# Patient Record
Sex: Female | Born: 1960 | Race: White | Hispanic: No | Smoking: Current every day smoker
Health system: Southern US, Community
[De-identification: ages and names within clinical notes are randomized; demographics above are authoritative.]

## PROBLEM LIST (undated history)

## (undated) DIAGNOSIS — J45909 Unspecified asthma, uncomplicated: Secondary | ICD-10-CM

## (undated) DIAGNOSIS — M359 Systemic involvement of connective tissue, unspecified: Secondary | ICD-10-CM

## (undated) DIAGNOSIS — N200 Calculus of kidney: Secondary | ICD-10-CM

## (undated) DIAGNOSIS — M1990 Unspecified osteoarthritis, unspecified site: Secondary | ICD-10-CM

## (undated) DIAGNOSIS — I10 Essential (primary) hypertension: Secondary | ICD-10-CM

## (undated) DIAGNOSIS — J449 Chronic obstructive pulmonary disease, unspecified: Secondary | ICD-10-CM

## (undated) DIAGNOSIS — K219 Gastro-esophageal reflux disease without esophagitis: Secondary | ICD-10-CM

## (undated) DIAGNOSIS — F419 Anxiety disorder, unspecified: Secondary | ICD-10-CM

## (undated) DIAGNOSIS — E119 Type 2 diabetes mellitus without complications: Secondary | ICD-10-CM

## (undated) DIAGNOSIS — G4730 Sleep apnea, unspecified: Secondary | ICD-10-CM

## (undated) HISTORY — DX: Unspecified osteoarthritis, unspecified site: M19.90

## (undated) HISTORY — PX: JOINT REPLACEMENT: SHX530

## (undated) HISTORY — PX: TONSILLECTOMY: SUR1361

---

## 1989-06-18 HISTORY — PX: TUBAL LIGATION: SHX77

## 1998-06-18 HISTORY — PX: ARTHROSCOPIC REPAIR ACL: SUR80

## 1998-06-18 HISTORY — PX: CHOLECYSTECTOMY: SHX55

## 2003-10-17 HISTORY — PX: ABDOMINAL HYSTERECTOMY: SHX81

## 2006-05-19 ENCOUNTER — Other Ambulatory Visit: Payer: Self-pay

## 2006-05-19 ENCOUNTER — Emergency Department: Payer: Self-pay | Admitting: Emergency Medicine

## 2006-05-20 ENCOUNTER — Inpatient Hospital Stay: Payer: Self-pay | Admitting: Surgery

## 2006-05-20 ENCOUNTER — Ambulatory Visit: Payer: Self-pay | Admitting: Emergency Medicine

## 2007-10-16 ENCOUNTER — Ambulatory Visit: Payer: Self-pay | Admitting: Orthopaedic Surgery

## 2007-10-16 ENCOUNTER — Other Ambulatory Visit: Payer: Self-pay

## 2007-11-04 ENCOUNTER — Ambulatory Visit: Payer: Self-pay | Admitting: Emergency Medicine

## 2007-11-04 DIAGNOSIS — J45909 Unspecified asthma, uncomplicated: Secondary | ICD-10-CM | POA: Insufficient documentation

## 2007-11-04 DIAGNOSIS — J4489 Other specified chronic obstructive pulmonary disease: Secondary | ICD-10-CM | POA: Insufficient documentation

## 2007-11-04 DIAGNOSIS — G4710 Hypersomnia, unspecified: Secondary | ICD-10-CM | POA: Insufficient documentation

## 2007-11-04 DIAGNOSIS — J449 Chronic obstructive pulmonary disease, unspecified: Secondary | ICD-10-CM | POA: Insufficient documentation

## 2007-11-04 DIAGNOSIS — I10 Essential (primary) hypertension: Secondary | ICD-10-CM | POA: Insufficient documentation

## 2007-11-04 DIAGNOSIS — G4730 Sleep apnea, unspecified: Secondary | ICD-10-CM

## 2007-11-12 ENCOUNTER — Encounter: Payer: Self-pay | Admitting: Emergency Medicine

## 2007-11-12 ENCOUNTER — Ambulatory Visit: Payer: Self-pay | Admitting: Pulmonary Disease

## 2007-11-12 ENCOUNTER — Ambulatory Visit (HOSPITAL_BASED_OUTPATIENT_CLINIC_OR_DEPARTMENT_OTHER): Admission: RE | Admit: 2007-11-12 | Discharge: 2007-11-12 | Payer: Self-pay | Admitting: Emergency Medicine

## 2007-11-18 ENCOUNTER — Ambulatory Visit: Payer: Self-pay | Admitting: Emergency Medicine

## 2007-11-18 ENCOUNTER — Encounter: Payer: Self-pay | Admitting: Emergency Medicine

## 2007-12-03 ENCOUNTER — Ambulatory Visit: Payer: Self-pay | Admitting: Emergency Medicine

## 2007-12-03 DIAGNOSIS — F17200 Nicotine dependence, unspecified, uncomplicated: Secondary | ICD-10-CM | POA: Insufficient documentation

## 2007-12-03 DIAGNOSIS — R0602 Shortness of breath: Secondary | ICD-10-CM | POA: Insufficient documentation

## 2007-12-04 ENCOUNTER — Telehealth: Payer: Self-pay | Admitting: Emergency Medicine

## 2007-12-04 ENCOUNTER — Telehealth (INDEPENDENT_AMBULATORY_CARE_PROVIDER_SITE_OTHER): Payer: Self-pay | Admitting: *Deleted

## 2007-12-31 ENCOUNTER — Telehealth (INDEPENDENT_AMBULATORY_CARE_PROVIDER_SITE_OTHER): Payer: Self-pay | Admitting: *Deleted

## 2008-01-01 ENCOUNTER — Telehealth (INDEPENDENT_AMBULATORY_CARE_PROVIDER_SITE_OTHER): Payer: Self-pay | Admitting: *Deleted

## 2008-01-07 ENCOUNTER — Ambulatory Visit: Payer: Self-pay | Admitting: Orthopedic Surgery

## 2008-01-08 ENCOUNTER — Ambulatory Visit: Payer: Self-pay | Admitting: Orthopedic Surgery

## 2008-04-12 ENCOUNTER — Ambulatory Visit: Payer: Self-pay | Admitting: Emergency Medicine

## 2008-04-12 DIAGNOSIS — R05 Cough: Secondary | ICD-10-CM

## 2008-04-12 DIAGNOSIS — R059 Cough, unspecified: Secondary | ICD-10-CM | POA: Insufficient documentation

## 2008-05-21 ENCOUNTER — Encounter: Payer: Self-pay | Admitting: Emergency Medicine

## 2008-09-10 ENCOUNTER — Emergency Department: Payer: Self-pay | Admitting: Emergency Medicine

## 2008-11-05 ENCOUNTER — Ambulatory Visit: Payer: Self-pay | Admitting: Cardiovascular Disease

## 2010-01-26 ENCOUNTER — Telehealth (INDEPENDENT_AMBULATORY_CARE_PROVIDER_SITE_OTHER): Payer: Self-pay | Admitting: *Deleted

## 2010-07-18 NOTE — Progress Notes (Signed)
  Phone Note Other Incoming   Request: Send information Summary of Call: Request for records received from [employer] Request forwarded to [facility].     

## 2010-09-08 ENCOUNTER — Ambulatory Visit: Payer: Self-pay | Admitting: Emergency Medicine

## 2010-09-08 HISTORY — PX: HERNIA REPAIR: SHX51

## 2010-10-12 ENCOUNTER — Ambulatory Visit: Payer: Self-pay | Admitting: Internal Medicine

## 2010-10-31 NOTE — Procedures (Signed)
NAME:  Stacey Beck, Stacey Beck NO.:  [id number]   MEDICAL RECORD NO.:  [id number]          PATIENT TYPE:  OUT   LOCATION:  SLEEP CENTER                 FACILITY:  [facility]   PHYSICIAN:  Coralyn Helling, MD        DATE OF BIRTH:  Mar 27, 1961   DATE OF STUDY:  11/12/2007                            NOCTURNAL POLYSOMNOGRAM   REFERRING PHYSICIAN:  Leslye Peer, MD   INDICATION FOR STUDY:  Stacey Beck is a 50 year old female who has a  history of hypertension, obesity, COPD, tobacco abuse and overnight  hypoxemia.  She is referred to the Sleep Lab for evaluation of  hypersomnia with obstructive sleep apnea.   EPWORTH SLEEPINESS SCORE:  1.   Height is 5 feet 7 inches tall, weight is 268 pounds, BMI is 42, neck  size is 17.   MEDICATIONS:  Perforomist, Asmanex, meloxicam, Spiriva, Diovan, Xopenex,  omeprazole, bupropion, Lunesta and Lovaza.   SLEEP ARCHITECTURE:  Sleep period time was 409 minutes, total sleep time  was 388 minutes, sleep efficiency was 93%.  Sleep latency is 6 minutes,  which is significantly reduced, REM latency is 79 minutes.  The patient  was observed in all stages of sleep but had a reduction in the  percentage of stage III sleep.  The patient slept in both supine and non-  supine positions.   RESPIRATORY DATA:  The average respiratory rate was 14, the overall  apnea-hypopnea index was 8, the events were exclusively obstructive in  nature.  The REM apnea-hypopnea index was 31, the REM supine apnea-  hypopnea index was 53, the REM non-supine apnea-hypopnea index was 22,  the non-REM apnea-hypopnea index was 2.5, the non-REM supine apnea-  hypopnea index was 3.8, the non-REM non-supine apnea-hypopnea index was  1.2.  The supine apnea-hypopnea index was 9.1, the non-supine apnea-  hypopnea index was 20.9.  Loud snoring was noted by the technician.   OXYGEN DATA:  The baseline oxygenation was 94%.  The oxygen saturation  nadir was 81%.  The patient spent a  total of 9 minutes with an oxygen  saturation less than 90% and 5 minutes with an oxygen saturation less  than 88%.   CARDIAC DATA:  The average heart rate was 57 and the rhythm strip showed  normal sinus rhythm.   MOVEMENT-PARASOMNIA:  The periodic limb movement index was 0.  The  patient had one rhythm strip.   IMPRESSIONS-RECOMMENDATIONS:  This study shows evidence for sleep  disordered breathing as the apnea-hypopnea index was 8.  She had a  preponderance of events during REM sleep, particularly in the supine  position.  What I would recommend initially is counseling with regards  to the importance of diet, exercise and weight reduction.  In addition,  she may also benefit from additional therapy for her sleep apnea which  could include either CPAP therapy, oral appliance or surgical  intervention.  Further discussion with the patient will be necessary to  determine what would be the optimal therapy for her.      Coralyn Helling, MD  Diplomat, American Board of Sleep Medicine  Electronically Signed  VS/MEDQ  D:  11/16/2007 08:54:13  T:  11/16/2007 09:40:24  Job:  [id number]

## 2011-07-18 ENCOUNTER — Inpatient Hospital Stay: Payer: Self-pay | Admitting: Internal Medicine

## 2011-07-18 LAB — COMPREHENSIVE METABOLIC PANEL
BUN: 10 mg/dL (ref 7–18)
Calcium, Total: 8.4 mg/dL — ABNORMAL LOW (ref 8.5–10.1)
Chloride: 104 mmol/L (ref 98–107)
Co2: 27 mmol/L (ref 21–32)
EGFR (African American): >60
EGFR (Non-African Amer.): 58 — ABNORMAL LOW
SGOT(AST): 28 U/L (ref 15–37)
SGPT (ALT): 60 U/L
Total Protein: 7.4 g/dL (ref 6.4–8.2)

## 2011-07-18 LAB — CBC
HGB: 15.4 g/dL (ref 12.0–16.0)
RBC: 4.93 10*6/uL (ref 3.80–5.20)
RDW: 12.6 % (ref 11.5–14.5)
WBC: 3.9 10*3/uL (ref 3.6–11.0)

## 2011-07-18 LAB — CK TOTAL AND CKMB (NOT AT ARMC): CK-MB: 0.8 ng/mL (ref 0.5–3.6)

## 2011-07-19 LAB — BASIC METABOLIC PANEL
Calcium, Total: 8.7 mg/dL (ref 8.5–10.1)
Chloride: 102 mmol/L (ref 98–107)
Co2: 27 mmol/L (ref 21–32)
Creatinine: 0.82 mg/dL (ref 0.60–1.30)
EGFR (African American): >60
Potassium: 4.6 mmol/L (ref 3.5–5.1)
Sodium: 138 mmol/L (ref 136–145)

## 2011-07-19 LAB — CBC WITH DIFFERENTIAL/PLATELET
Basophil #: 0 10*3/uL (ref 0.0–0.1)
Basophil %: 0.2 %
Eosinophil %: 0 %
HCT: 46.5 % (ref 35.0–47.0)
MCV: 92 fL (ref 80–100)
Monocyte %: 2.5 %
Neutrophil #: 9.2 10*3/uL — ABNORMAL HIGH (ref 1.4–6.5)
RDW: 12.6 % (ref 11.5–14.5)
WBC: 10 10*3/uL (ref 3.6–11.0)

## 2011-11-06 ENCOUNTER — Ambulatory Visit: Payer: Self-pay | Admitting: Family Medicine

## 2012-01-09 ENCOUNTER — Emergency Department: Payer: Self-pay | Admitting: *Deleted

## 2012-11-29 ENCOUNTER — Emergency Department: Payer: Self-pay | Admitting: Emergency Medicine

## 2013-05-27 ENCOUNTER — Ambulatory Visit: Payer: Self-pay | Admitting: Family Medicine

## 2013-06-18 HISTORY — PX: BREAST SURGERY: SHX581

## 2013-07-06 ENCOUNTER — Ambulatory Visit: Payer: Self-pay | Admitting: Gastroenterology

## 2013-07-08 LAB — PATHOLOGY REPORT

## 2013-07-09 ENCOUNTER — Emergency Department: Payer: Self-pay | Admitting: Emergency Medicine

## 2013-07-09 LAB — URINALYSIS, COMPLETE
BACTERIA: NONE SEEN
BLOOD: NEGATIVE
Bilirubin,UR: NEGATIVE
Glucose,UR: NEGATIVE mg/dL (ref 0–75)
KETONE: NEGATIVE
LEUKOCYTE ESTERASE: NEGATIVE
NITRITE: NEGATIVE
PH: 6 (ref 4.5–8.0)
Protein: NEGATIVE
Specific Gravity: 1.02 (ref 1.003–1.030)
Squamous Epithelial: 4

## 2013-07-09 LAB — COMPREHENSIVE METABOLIC PANEL
ALBUMIN: 3.8 g/dL (ref 3.4–5.0)
Alkaline Phosphatase: 97 U/L
Anion Gap: 5 — ABNORMAL LOW (ref 7–16)
BUN: 13 mg/dL (ref 7–18)
Bilirubin,Total: 0.3 mg/dL (ref 0.2–1.0)
CALCIUM: 9.4 mg/dL (ref 8.5–10.1)
CO2: 29 mmol/L (ref 21–32)
CREATININE: 1.02 mg/dL (ref 0.60–1.30)
Chloride: 106 mmol/L (ref 98–107)
EGFR (African American): >60
EGFR (Non-African Amer.): >60
Glucose: 160 mg/dL — ABNORMAL HIGH (ref 65–99)
Osmolality: 283 (ref 275–301)
POTASSIUM: 4.1 mmol/L (ref 3.5–5.1)
SGOT(AST): 27 U/L (ref 15–37)
SGPT (ALT): 54 U/L (ref 12–78)
SODIUM: 140 mmol/L (ref 136–145)
TOTAL PROTEIN: 7.4 g/dL (ref 6.4–8.2)

## 2013-07-09 LAB — CBC
HCT: 47.6 % — ABNORMAL HIGH (ref 35.0–47.0)
HGB: 16.2 g/dL — ABNORMAL HIGH (ref 12.0–16.0)
MCH: 31.2 pg (ref 26.0–34.0)
MCHC: 34 g/dL (ref 32.0–36.0)
MCV: 92 fL (ref 80–100)
Platelet: 214 10*3/uL (ref 150–440)
RBC: 5.2 10*6/uL (ref 3.80–5.20)
RDW: 12.9 % (ref 11.5–14.5)
WBC: 6 10*3/uL (ref 3.6–11.0)

## 2013-07-15 ENCOUNTER — Ambulatory Visit: Payer: Self-pay | Admitting: Surgery

## 2014-03-04 ENCOUNTER — Emergency Department: Payer: Self-pay | Admitting: Emergency Medicine

## 2014-10-10 NOTE — Discharge Summary (Signed)
PATIENT NAME:  Stacey AlaminLLEN, Stacey Beck MR#:  [id number] DATE OF BIRTH:  1961/05/28  DATE OF ADMISSION:  07/18/2011 DATE OF DISCHARGE:  07/19/2011  PRIMARY CARE PHYSICIAN:  Dr. Lacie ScottsNiemeyer  CONSULTATIONS:  None.  DISCHARGE DIAGNOSES:  1. Chronic obstructive pulmonary disease exacerbation.  2. Hypertension.  3. Gastroesophageal reflux disease.  4. Anxiety and depression.  5. Tobacco abuse.   DISCHARGE MEDICATIONS:  1. Tramadol 50 mg 4 times daily as needed for pain. 2. Bupropion 450 mg extended release daily.  3. Flexeril 10 mg t.i.d. as needed.  4. Amitiza 24 mcg p.o. b.i.d.  5. Advair Diskus 250/50 1 puff b.i.d.  6. Hydrochlorothiazide/ enalapril 12.5/20 mg p.o. daily.  7. Nexium 40 mg p.o. daily.  8. Alprazolam 1 mg 3 times daily.  9. Flonase 0.05 mg inhalation once per 2 times daily.  10. Prednisone 40 mg daily for two days, 30 mg daily for two days, 20 mg daily for two days, 10 mg daily for two days, then stop. 11. Levaquin 500 mg p.o. daily for five days.   DIET: Low sodium diet.   HOSPITAL COURSE:  411. 54 year old female patient with history of tobacco abuse who came in because of trouble breathing and cough. The patient was given nebulizers and antibiotics. The patient did not get better so she came in. When she came her lungs had bilateral wheezing and labs were essentially within normal limits. Chest x-ray showed no pneumonia and influenza titers were negative. She was started on IV steroids along with antibiotics and nebulizers and oxygen. The patient started to feel much better with no wheezing today and oxygen levels are 96% on room air.  The patient is afebrile. She wanted to go home. I am discharging her with Levaquin and also prednisone taper and she can continue her Advair along with her Ventolin as needed.  2. The patient has a history of hypertension. She is on quinapril and HCTZ. Continue that.  3. The patient has a history of gastroesophageal reflux disease on PPIs.  Continue. 4. History of anxiety and depression on alprazolam. She can continue. 5. History of tobacco abuse. She is sure she will quit. She is on bupropion. Continue that.  6. We are going to get oxygenation on exertion and see if she needs any oxygen.  She wants to follow with Dr. Meredeth IdeFleming of pulmonology for pulmonary function testing. We will give her an appointment to follow up as an outpatient. 7. The patient's condition at this time is stable.  LABORATORY DATA:  Electrolytes: Sodium 138, potassium 4.6, chloride 102, bicarbonate 27, BUN 14, creatinine 1.82, glucose 197 today. WBC this morning 10, hemoglobin is 15.6, hematocrit 46.5, platelets 143. Influenza titers are negative. The patient's troponins have been negative. Chest x-ray showed no acute cardiopulmonary abnormality.   TOTAL TIME SPENT ON DISCHARGE PREPARATION: More than 30 minutes.    ____________________________ Katha HammingSnehalatha Vernita Tague, MD sk:bjt D: 07/19/2011 12:42:13 ET T: 07/20/2011 12:20:10 ET JOB#: [id number]  cc: Katha HammingSnehalatha Maecie Sevcik, MD, <Dictator> Katha HammingSNEHALATHA Zaven Klemens MD ELECTRONICALLY SIGNED 08/06/2011 16:19

## 2014-10-10 NOTE — H&P (Signed)
PATIENT NAME:  Stacey Beck, Philena H MR#:  [id number] DATE OF BIRTH:  03-Apr-1961  DATE OF ADMISSION:  07/17/2011  CHIEF COMPLAINT: Shortness of breath and cough.   HISTORY OF PRESENT ILLNESS: The patient is a 54 year old Caucasian female who presents with chief complaint of symptoms that began two weeks ago. The patient saw her primary care physician about a week back and she was put on nebulizers and azithromycin. She has not had much relief. She reports that she stopped taking her albuterol a couple of months ago. She restarted the nebulizers two days ago.  The antibiotics and nebulizers have helped her symptoms partially. She has developed a productive cough. She is bringing up clear sputum. The patient reports intermittent chest pain associated with the cough. The patient has never been intubated or put on a ventilator.   PAST MEDICAL HISTORY:  1. Gastroesophageal reflux disease. 2. Arthritis.  3. Anemia.  4. Asthma. 5. Chronic obstructive pulmonary disease.  6. Depression and anxiety.  7. Sleep apnea.  8. Tonsillectomy.  9. Left knee surgery.  10. Tubal ligation. 11. Partial hysterectomy.   ALLERGIES: No known drug allergies.   CURRENT MEDICATIONS:  1. Advair Diskus 250/50 mcg 1 puff b.i.d.  2. Alprazolam 1 mg p.o. t.i.d.  3. Amitiza 24 mcg p.o. b.i.d.  4. Azithromycin 500 mg p.o. daily.  5. Budesonide one inhaler b.i.d.  6. Bupropion 150 mg p.o. q. 24 hours.  7. Cyclobenzaprine 10 mg p.o. t.i.d.  8. Flonase 0.05, one spray b.i.d.  9. Hydrochlorothiazide 12.5/20 p.o. daily.  10. Nexium 40 mg p.o. daily.  11. Prednisone 50 mg p.o. daily.  12. Tramadol 50 mg p.o. 4 times daily.   ALLERGIES: No known drug allergies.   SOCIAL HISTORY: The patient reports she smokes 1 pack per day. She denies alcohol abuse or drug abuse. She is disabled.   FAMILY HISTORY: Positive for diabetes.   REVIEW OF SYSTEMS: CONSTITUTIONAL: The patient denies any fevers, chills, or night sweats. HEENT: The  patient denies any hearing loss, dysphagia, visual problems, or sore throat.  CARDIOVASCULAR: The patient denies any orthopnea, PND, or syncope. RESPIRATORY: The patient denies hemoptysis. GI: The patient denies any abdominal pain, hematemesis, hematochezia, or melena. GU: The patient denies any hematuria, dysuria, or frequency. NEURO: The patient denies any headache, focal weakness, or seizures. SKIN: The patient denies any lesions or rash. ENDOCRINE: The patient denies any polyuria, polyphagia, or polydipsia.  MUSCULOSKELETAL: The patient denies any arthritis, joint effusion, or swelling. HEMATOLOGIC: The patient denies any easy bleeding or bruises.   PHYSICAL EXAMINATION:  VITAL SIGNS: Temperature 97.1, heart rate 103, respiratory rate 26, blood pressure 167/81, O2 sats 99%.   HEENT: Atraumatic, normocephalic. Pupils are equal, round, and reactive to light. Extraocular movements intact. Sclerae anicteric. Mucous membranes are moist.   NECK: Supple. No organomegaly.   CARDIOVASCULAR:  S1, S2. Regular rate and rhythm. No gallops. No thrills. No murmurs.   LUNGS: The patient has wheezing bilaterally. There are no rales, no rhonchi.   GI: Abdomen is soft, nontender, nondistended. Normal bowel sounds. No hepatosplenomegaly.   GU: There is no hematuria or masses noted.   SKIN: No lesions, no rash.   ENDOCRINE:  No masses, no thyromegaly.   LYMPH: No lymphadenopathy or nodes palpable.   NEUROLOGIC: Cranial nerves II through XII grossly intact. Motor strength is five out of five in bilateral upper and lower extremities. Sensation within normal limits. No focal neurological deficit noted on examination.   MUSCULOSKELETAL: No arthritis, joint  effusion, or swelling.  HEMATOLOGIC:  No ecchymosis, no bleeding, and no petechiae.   EXTREMITIES: No cyanosis, clubbing, or edema. 2+ pedal pulses noted bilaterally.   LABORATORY DATA: Total CK 110, CK-MB 0.8. WBC count 3900, hemoglobin 15.4,  hematocrit 44.4, platelet count 159.  Glucose 175, BUN 10, creatinine 1.07. Sodium 141, potassium 4.2, chloride 104, CK total 27, calcium 8.4, total bilirubin 0.2. Alkaline phosphatase 75, ALT 60, AST 28, total protein 7.4, albumin 3.7. Estimated GFR 58. Troponin less than 0.02. Influenza swab is negative.  ASSESSMENT AND PLAN: 1. The patient is a 54 year old female who presents with chief complaint of shortness of breath and acute chronic obstructive pulmonary disease exacerbation. Admit to the medical floor, start the patient on chronic obstructive pulmonary disease clinical pathway orders. IV Solu-Medrol, nebulizers, IV fluids, oxygen.  2. Hypertension. Continue Coreg and hydrochlorothiazide.  3. Hyperlipidemia. Continue Lipitor. 4. Gastroesophageal reflux disease. Continue omeprazole.  5. Tobacco abuse. Smoking cessation recommended.  ____________________________ Donia Ast, MD jsp:bjt D:  07/18/2011 07:24:13 ET         T: 07/18/2011 08:08:53 ET          JOB#: [id number]  cc: Donia Ast, MD, <Dictator> Donia Ast MD ELECTRONICALLY SIGNED 07/18/2011 22:03

## 2014-11-01 IMAGING — CT CT ABD-PELV W/ CM
2 of 5 series · 17 of 46 positions shown, 19 images · IV contrast (isovue)
Comparison: US RENAL KIDNEY dated 07/09/2013

CLINICAL DATA: Epigastric abdominal pain. Evaluate for ventral
hernia. Left low back pain worsening for 3 weeks.

EXAM:
CT ABDOMEN AND PELVIS WITH CONTRAST
TECHNIQUE: Multidetector CT imaging of the abdomen and pelvis was performed
using the standard protocol following bolus administration of
intravenous contrast.
CONTRAST:  125 cc Isovue 370

[Series 2: routine with · axial · 0.84mm/px · z∈[-966,-566]mm · 14 of 91 slices shown, 16 images]
[im 6/91  soft-tissue]
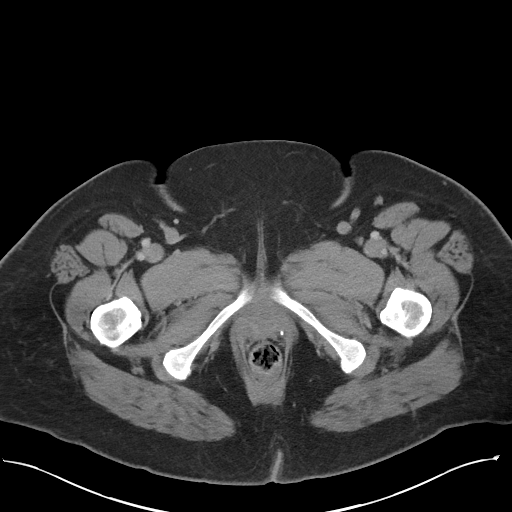
[im 6/91  bone]
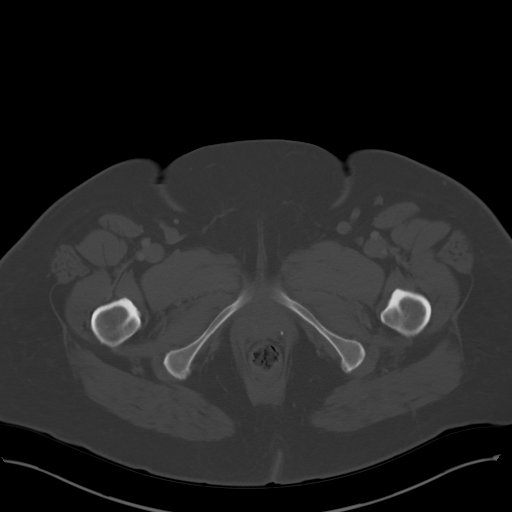
[im 11/91  soft-tissue]
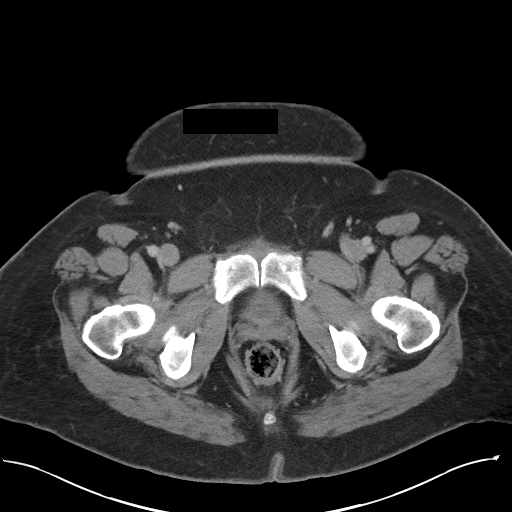
[im 21/91  soft-tissue]
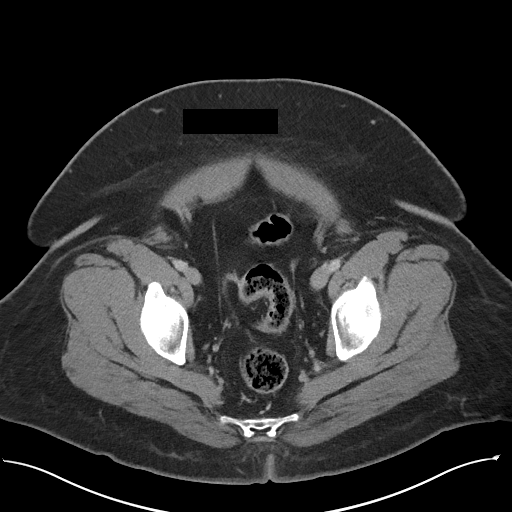
[im 26/91  soft-tissue]
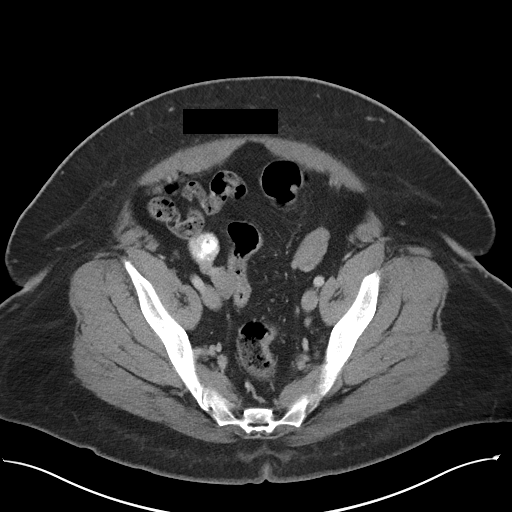
[im 31/91  soft-tissue]
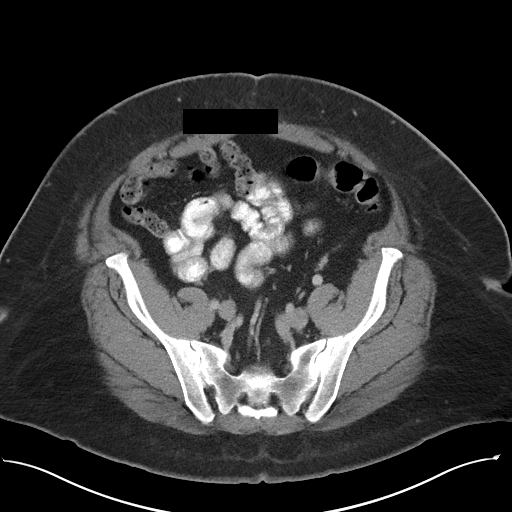
[im 36/91  soft-tissue]
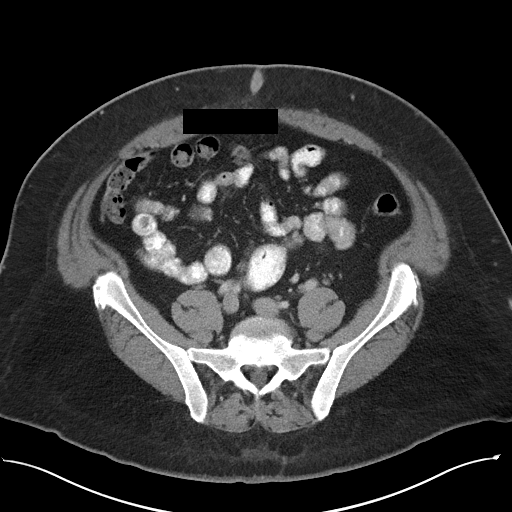
[im 41/91  soft-tissue]
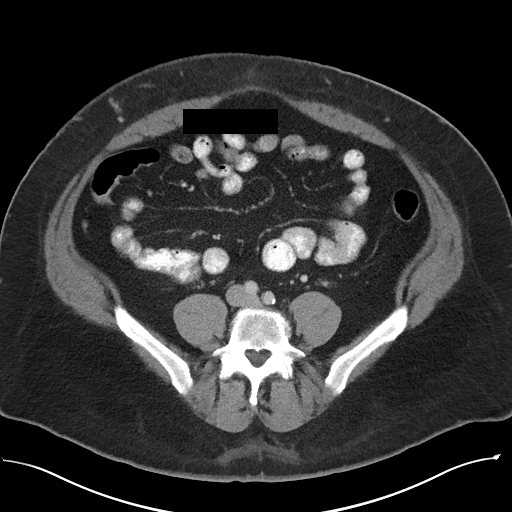
[im 51/91  soft-tissue]
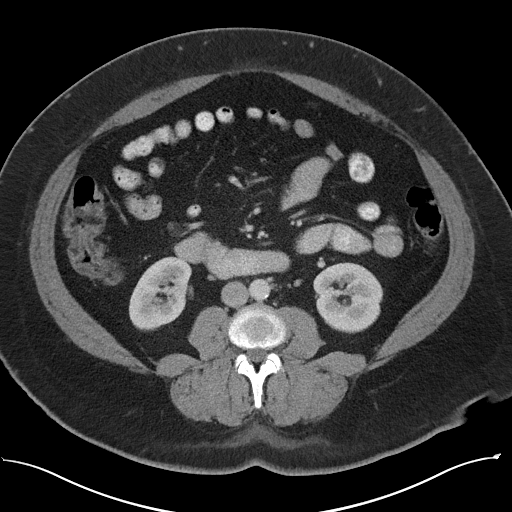
[im 56/91  soft-tissue]
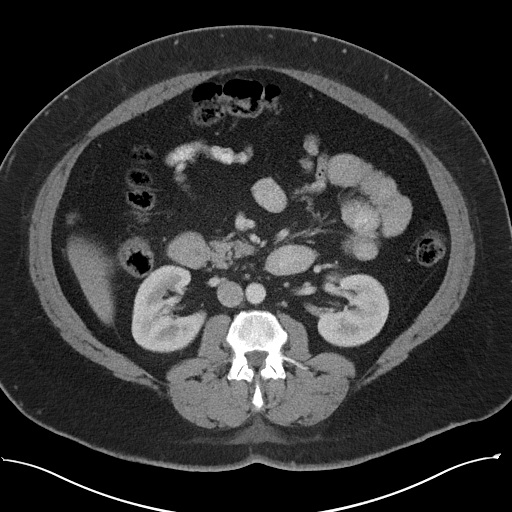
[im 56/91  bone]
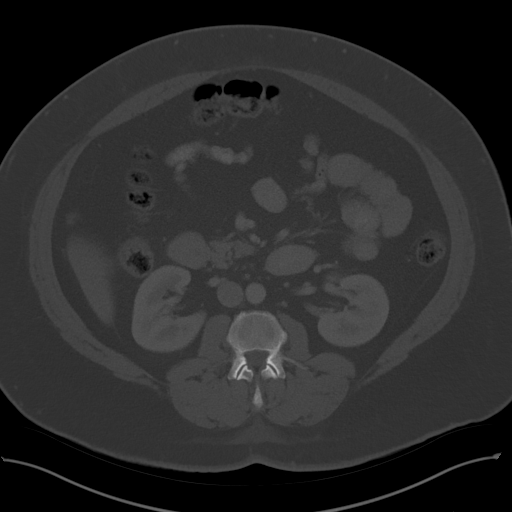
[im 61/91  soft-tissue]
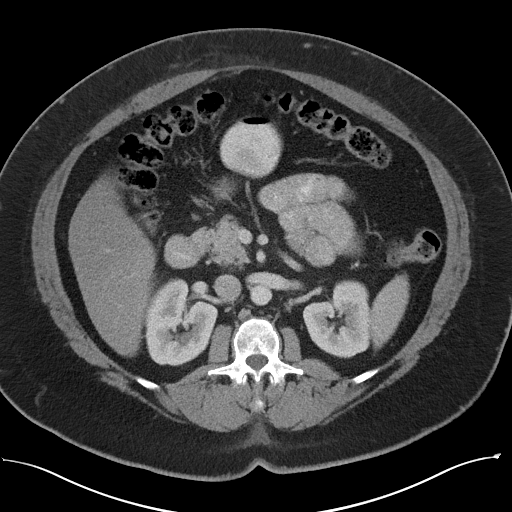
[im 66/91  soft-tissue]
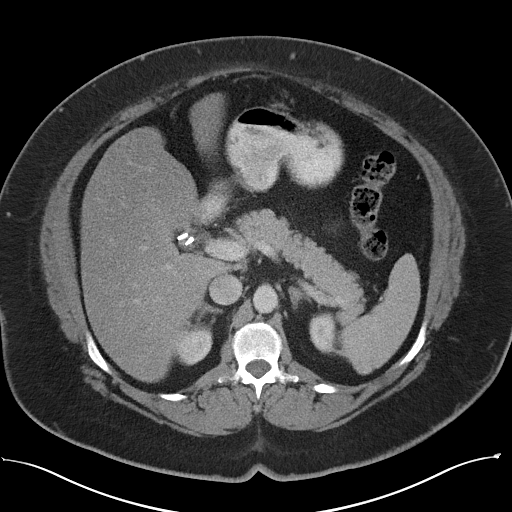
[im 71/91  soft-tissue]
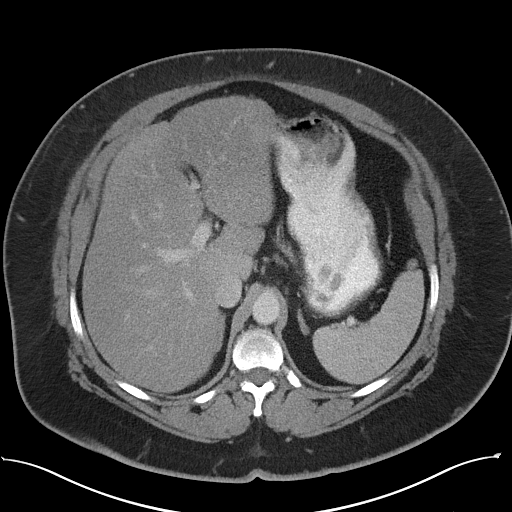
[im 81/91  soft-tissue]
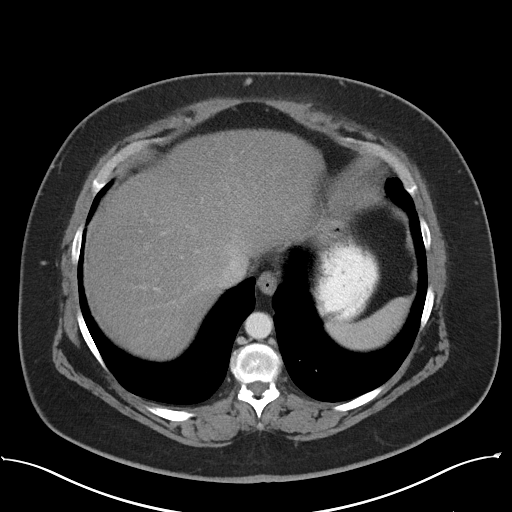
[im 86/91  soft-tissue]
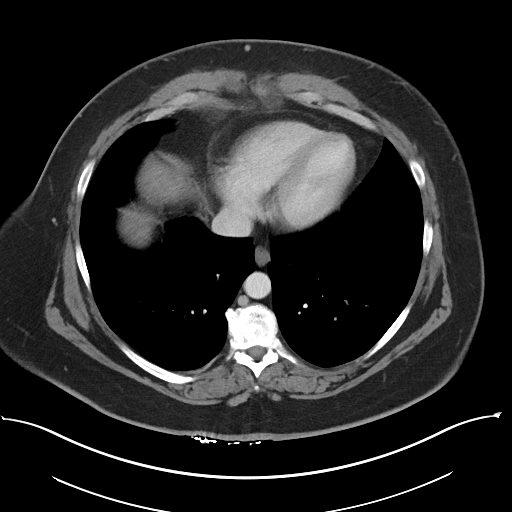

[Series 6: cor routine with · coronal · 0.89mm/px · 3 of 204 slices shown]
[im 68/204  soft-tissue]
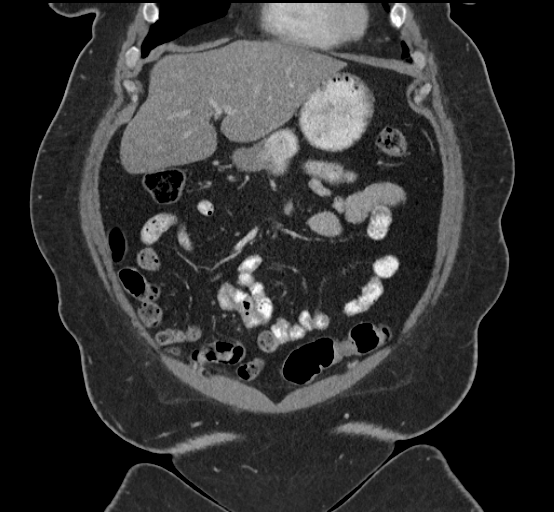
[im 91/204  soft-tissue]
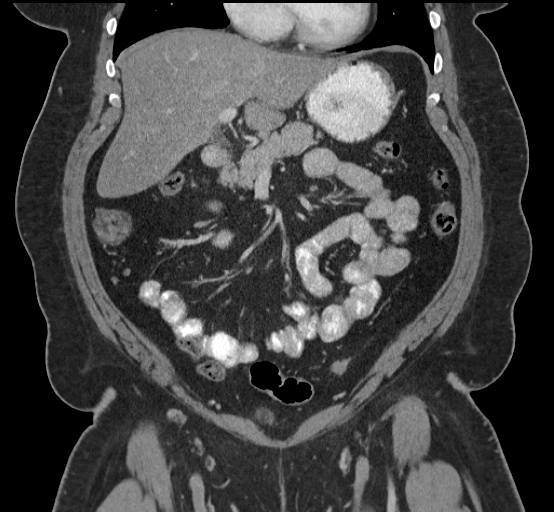
[im 113/204  soft-tissue]
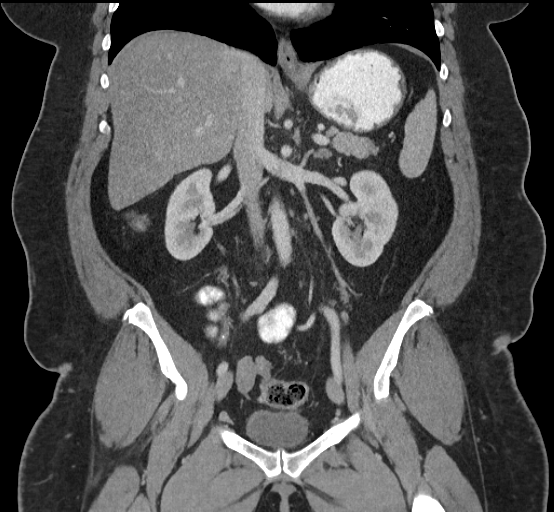

[17 of 46 positions shown; findings below may reference images not displayed]

FINDINGS: Lower Chest: Clear lung bases. Normal heart size without pericardial
or pleural effusion.

Abdomen/Pelvis: Mild to moderate hepatic steatosis. No focal liver
lesion.

Normal spleen, stomach, pancreas. Cholecystectomy, without biliary
ductal dilatation. Descending duodenal diverticulum. Normal right
adrenal gland. Mild left adrenal thickening. Normal kidneys.
Multiple renal arteries bilaterally. Early aortic atherosclerosis.
No retroperitoneal or retrocrural adenopathy. Normal colon,
appendix, and terminal ileum. Normal small bowel without abdominal
ascites. Borderline enlarged right external iliac node, likely
reactive. 1.0 cm. Normal urinary bladder. Hysterectomy. No adnexal
mass. No significant free fluid.

Bones/Musculoskeletal:  No acute osseous abnormality.
IMPRESSION: 1.  No acute process in the abdomen or pelvis.
2. Hepatic steatosis.

## 2015-01-26 ENCOUNTER — Emergency Department
Admission: EM | Admit: 2015-01-26 | Discharge: 2015-01-26 | Disposition: A | Discharge status: Home / Self Care | Payer: Medicare Other | Attending: Emergency Medicine | Admitting: Emergency Medicine

## 2015-01-26 ENCOUNTER — Emergency Department: Payer: Medicare Other

## 2015-01-26 ENCOUNTER — Encounter: Payer: Self-pay | Admitting: Emergency Medicine

## 2015-01-26 DIAGNOSIS — Z720 Tobacco use: Secondary | ICD-10-CM | POA: Insufficient documentation

## 2015-01-26 DIAGNOSIS — I10 Essential (primary) hypertension: Secondary | ICD-10-CM | POA: Diagnosis not present

## 2015-01-26 DIAGNOSIS — N23 Unspecified renal colic: Secondary | ICD-10-CM | POA: Diagnosis not present

## 2015-01-26 DIAGNOSIS — Z79899 Other long term (current) drug therapy: Secondary | ICD-10-CM | POA: Insufficient documentation

## 2015-01-26 DIAGNOSIS — M549 Dorsalgia, unspecified: Secondary | ICD-10-CM | POA: Diagnosis present

## 2015-01-26 DIAGNOSIS — Z7951 Long term (current) use of inhaled steroids: Secondary | ICD-10-CM | POA: Insufficient documentation

## 2015-01-26 DIAGNOSIS — R21 Rash and other nonspecific skin eruption: Secondary | ICD-10-CM | POA: Diagnosis not present

## 2015-01-26 DIAGNOSIS — N12 Tubulo-interstitial nephritis, not specified as acute or chronic: Secondary | ICD-10-CM | POA: Diagnosis not present

## 2015-01-26 HISTORY — DX: Calculus of kidney: N20.0

## 2015-01-26 LAB — URINALYSIS COMPLETE WITH MICROSCOPIC (ARMC ONLY)
BILIRUBIN URINE: NEGATIVE
Glucose, UA: NEGATIVE mg/dL
Ketones, ur: NEGATIVE mg/dL
Nitrite: NEGATIVE
PH: 5 (ref 5.0–8.0)
Protein, ur: 30 mg/dL — AB
SPECIFIC GRAVITY, URINE: 1.02 (ref 1.005–1.030)

## 2015-01-26 LAB — BASIC METABOLIC PANEL
Anion gap: 9 (ref 5–15)
BUN: 14 mg/dL (ref 6–20)
CALCIUM: 9.6 mg/dL (ref 8.9–10.3)
CHLORIDE: 100 mmol/L — AB (ref 101–111)
CO2: 32 mmol/L (ref 22–32)
CREATININE: 0.98 mg/dL (ref 0.44–1.00)
GFR calc non Af Amer: >60 mL/min (ref >60)
Glucose, Bld: 140 mg/dL — ABNORMAL HIGH (ref 65–99)
Potassium: 4.8 mmol/L (ref 3.5–5.1)
Sodium: 141 mmol/L (ref 135–145)

## 2015-01-26 LAB — CBC
HCT: 48.1 % — ABNORMAL HIGH (ref 35.0–47.0)
Hemoglobin: 16.3 g/dL — ABNORMAL HIGH (ref 12.0–16.0)
MCH: 31.1 pg (ref 26.0–34.0)
MCHC: 34 g/dL (ref 32.0–36.0)
MCV: 91.5 fL (ref 80.0–100.0)
PLATELETS: 197 10*3/uL (ref 150–440)
RBC: 5.26 MIL/uL — ABNORMAL HIGH (ref 3.80–5.20)
RDW: 12.9 % (ref 11.5–14.5)
WBC: 7.5 10*3/uL (ref 3.6–11.0)

## 2015-01-26 MED ORDER — OXYCODONE-ACETAMINOPHEN 5-325 MG PO TABS: 1.0000 | ORAL_TABLET | Freq: Four times a day (QID) | ORAL | Status: DC | PRN

## 2015-01-26 MED ORDER — HYDROMORPHONE HCL 1 MG/ML IJ SOLN
1.0000 mg | Freq: Once | INTRAMUSCULAR | Status: AC
  Administered 2015-01-26: 1 mg via INTRAMUSCULAR
  Filled 2015-01-26: qty 1

## 2015-01-26 MED ORDER — OXYCODONE-ACETAMINOPHEN 5-325 MG PO TABS: 1.0000 | ORAL_TABLET | Freq: Once | ORAL | Status: DC

## 2015-01-26 MED ORDER — ONDANSETRON 4 MG PO TBDP
4.0000 mg | ORAL_TABLET | Freq: Once | ORAL | Status: AC
  Administered 2015-01-26: 4 mg via ORAL
  Filled 2015-01-26: qty 1

## 2015-01-26 MED ORDER — KETOROLAC TROMETHAMINE 10 MG PO TABS: 10.0000 mg | ORAL_TABLET | Freq: Three times a day (TID) | ORAL | Status: DC | PRN

## 2015-01-26 MED ORDER — SULFAMETHOXAZOLE-TRIMETHOPRIM 800-160 MG PO TABS: 1.0000 | ORAL_TABLET | Freq: Two times a day (BID) | ORAL | Status: DC

## 2015-01-26 MED ORDER — CEFTRIAXONE SODIUM 1 G IJ SOLR
1.0000 g | Freq: Once | INTRAMUSCULAR | Status: AC
  Administered 2015-01-26: 1 g via INTRAMUSCULAR
  Filled 2015-01-26: qty 10

## 2015-01-26 MED ORDER — OXYCODONE-ACETAMINOPHEN 5-325 MG PO TABS
1.0000 | ORAL_TABLET | Freq: Once | ORAL | Status: AC
  Administered 2015-01-26: 1 via ORAL
  Filled 2015-01-26: qty 1

## 2015-01-26 NOTE — ED Notes (Signed)
Patient ambulatory to triage with steady gait, without difficulty or distress noted; pt reports lower back pain with urinary frequency x week; st hx kidney stones

## 2015-01-26 NOTE — ED Notes (Signed)
MD at bedside. 

## 2015-01-26 NOTE — Discharge Instructions (Signed)
You're being treated for urinary tract/kidney infection. We discussed, return to emergency department immediately for any worsening condition including fever, confusion or altered mental status, inability to urinate, worsening pain, or any other symptoms concerning to you.   Pyelonephritis, Adult Pyelonephritis is a kidney infection. In general, there are 2 main types of pyelonephritis:  Infections that come on quickly without any warning (acute pyelonephritis).  Infections that persist for a long period of time (chronic pyelonephritis). CAUSES  Two main causes of pyelonephritis are:  Bacteria traveling from the bladder to the kidney. This is a problem especially in pregnant women. The urine in the bladder can become filled with bacteria from multiple causes, including:  Inflammation of the prostate gland (prostatitis).  Sexual intercourse in females.  Bladder infection (cystitis).  Bacteria traveling from the bloodstream to the tissue part of the kidney. Problems that may increase your risk of getting a kidney infection include:  Diabetes.  Kidney stones or bladder stones.  Cancer.  Catheters placed in the bladder.  Other abnormalities of the kidney or ureter. SYMPTOMS   Abdominal pain.  Pain in the side or flank area.  Fever.  Chills.  Upset stomach.  Blood in the urine (dark urine).  Frequent urination.  Strong or persistent urge to urinate.  Burning or stinging when urinating. DIAGNOSIS  Your caregiver may diagnose your kidney infection based on your symptoms. A urine sample may also be taken. TREATMENT  In general, treatment depends on how severe the infection is.   If the infection is mild and caught early, your caregiver may treat you with oral antibiotics and send you home.  If the infection is more severe, the bacteria may have gotten into the bloodstream. This will require intravenous (IV) antibiotics and a hospital stay. Symptoms may  include:  High fever.  Severe flank pain.  Shaking chills.  Even after a hospital stay, your caregiver may require you to be on oral antibiotics for a period of time.  Other treatments may be required depending upon the cause of the infection. HOME CARE INSTRUCTIONS   Take your antibiotics as directed. Finish them even if you start to feel better.  Make an appointment to have your urine checked to make sure the infection is gone.  Drink enough fluids to keep your urine clear or pale yellow.  Take medicines for the bladder if you have urgency and frequency of urination as directed by your caregiver. SEEK IMMEDIATE MEDICAL CARE IF:   You have a fever or persistent symptoms for more than 2-3 days.  You have a fever and your symptoms suddenly get worse.  You are unable to take your antibiotics or fluids.  You develop shaking chills.  You experience extreme weakness or fainting.  There is no improvement after 2 days of treatment. MAKE SURE YOU:  Understand these instructions.  Will watch your condition.  Will get help right away if you are not doing well or get worse. Document Released: 06/04/2005 Document Revised: 12/04/2011 Document Reviewed: 11/08/2010 [facility] Patient Information 2015 [place], [place]. This information is not intended to replace advice given to you by your health care provider. Make sure you discuss any questions you have with your health care provider.

## 2015-01-26 NOTE — ED Provider Notes (Signed)
[facility] Emergency Department Provider Note   ____________________________________________  Time seen: 7:15 AM I have reviewed the triage vital signs and the triage nursing note.  HISTORY  Chief Complaint Back Pain   Historian Patient  HPI Stacey Beck is a 54 y.o. female who is been having bilateral flank pain associated also with urinary frequency for several days now. She does have a history of kidney stones, and this feels similar but is on both sides. No hematuria. Chills but no fevers. Nausea but no vomiting. No diarrhea. No pelvic pain. Symptoms are moderate to severe with pain at 9 out of 10.   Past Medical History  Diagnosis Date  . Kidney stone     Patient Active Problem List   Diagnosis Date Noted  . COUGH 04/12/2008  . TOBACCO ABUSE 12/03/2007  . DYSPNEA 12/03/2007  . HYPERTENSION 11/04/2007  . ASTHMA 11/04/2007  . COPD 11/04/2007  . HYPERSOMNIA WITH SLEEP APNEA UNSPECIFIED 11/04/2007    Past Surgical History  Procedure Laterality Date  . Abdominal hysterectomy    . Cholecystectomy    . Hernia repair    . Tubal ligation      Current Outpatient Rx  Name  Route  Sig  Dispense  Refill  . albuterol (PROVENTIL HFA;VENTOLIN HFA) 108 (90 BASE) MCG/ACT inhaler   Inhalation   Inhale 2 puffs into the lungs every 4 (four) hours as needed for wheezing or shortness of breath.         . ALPRAZolam (XANAX) 1 MG tablet   Oral   Take 1 mg by mouth 3 (three) times daily as needed for anxiety.         Marland Kitchen buPROPion (WELLBUTRIN XL) 150 MG 24 hr tablet   Oral   Take 150 mg by mouth daily.         . cyclobenzaprine (FLEXERIL) 10 MG tablet   Oral   Take 10 mg by mouth 3 (three) times daily as needed for muscle spasms.         . fluticasone (FLONASE) 50 MCG/ACT nasal spray   Each Nare   Place 1 spray into both nostrils daily.         . Fluticasone-Salmeterol (ADVAIR) 250-50 MCG/DOSE AEPB   Inhalation   Inhale 1 puff into the  lungs 2 (two) times daily.         . formoterol (PERFOROMIST) 20 MCG/2ML nebulizer solution   Nebulization   Take 20 mcg by nebulization 2 (two) times daily.         Marland Kitchen gabapentin (NEURONTIN) 400 MG capsule   Oral   Take 400 mg by mouth 3 (three) times daily.         Marland Kitchen lisinopril (PRINIVIL,ZESTRIL) 10 MG tablet   Oral   Take 10 mg by mouth daily.         Marland Kitchen omeprazole (PRILOSEC) 20 MG capsule   Oral   Take 20 mg by mouth daily.         Marland Kitchen tiotropium (SPIRIVA) 18 MCG inhalation capsule   Inhalation   Place 18 mcg into inhaler and inhale daily.         . traMADol (ULTRAM) 50 MG tablet   Oral   Take 50 mg by mouth every 6 (six) hours as needed.         Marland Kitchen ketorolac (TORADOL) 10 MG tablet   Oral   Take 1 tablet (10 mg total) by mouth every 8 (eight) hours as needed for  moderate pain.   15 tablet   0   . oxyCODONE-acetaminophen (ROXICET) 5-325 MG per tablet   Oral   Take 1-2 tablets by mouth every 6 (six) hours as needed for severe pain.   10 tablet   0   . sulfamethoxazole-trimethoprim (BACTRIM DS,SEPTRA DS) 800-160 MG per tablet   Oral   Take 1 tablet by mouth 2 (two) times daily.   20 tablet   0     Allergies Review of patient's allergies indicates no known allergies.  No family history on file.  Social History Social History  Substance Use Topics  . Smoking status: Current Every Day Smoker -- 1.00 packs/day    Types: Cigarettes  . Smokeless tobacco: None  . Alcohol Use: No    Review of Systems   Constitutional: Negative for fever. Eyes: Negative for visual changes. ENT: Negative for sore throat. Cardiovascular: Negative for chest pain. Respiratory: Negative for shortness of breath. Gastrointestinal: Negative for vomiting and diarrhea. Genitourinary: Positive for dysuria. Musculoskeletal: Positive for back pain. Skin: Chronic skin rash to abdomen and left breast Neurological: Negative for headaches, focal weakness or numbness. 10 point  Review of Systems otherwise negative ____________________________________________   PHYSICAL EXAM:  VITAL SIGNS: ED Triage Vitals  Enc Vitals Group     BP 01/26/15 0332 153/104 mmHg     Pulse Rate 01/26/15 0332 87     Resp 01/26/15 0332 22     Temp 01/26/15 0332 97.8 F (36.6 C)     Temp Source 01/26/15 0332 Oral     SpO2 01/26/15 0332 97 %     Weight 01/26/15 0332 282 lb (127.914 kg)     Height 01/26/15 0332 5\' 7"  (1.702 m)     Head Cir --      Peak Flow --      Pain Score 01/26/15 0333 10     Pain Loc --      Pain Edu? --      Excl. in GC? --      Constitutional: Alert and oriented. Well appearing and in no distress. Eyes: Conjunctivae are normal. PERRL. Normal extraocular movements. ENT   Head: Normocephalic and atraumatic.   Nose: No congestion/rhinnorhea.   Mouth/Throat: Mucous membranes are moist.   Neck: No stridor. Cardiovascular/Chest: Normal rate, regular rhythm.  No murmurs, rubs, or gallops. Respiratory: Normal respiratory effort without tachypnea nor retractions. Breath sounds are clear and equal bilaterally. No wheezes/rales/rhonchi. Gastrointestinal: Soft. No distention, no guarding, no rebound. Nontender . Obese.  Genitourinary/rectal:Deferred Musculoskeletal: Nontender with normal range of motion in all extremities. No joint effusions.  No lower extremity tenderness nor edema. Neurologic:  Normal speech and language. No gross or focal neurologic deficits are appreciated. Skin:  Skin is warm, dry and intact. Dry/scaly chronic-appearing rash over abdomen and left breast. No cellulitis. Psychiatric: Mood and affect are normal. Speech and behavior are normal. Patient exhibits appropriate insight and judgment.  ____________________________________________   EKG I, Governor Rooks, MD, the attending physician have personally viewed and interpreted all ECGs.  No EKG performed ____________________________________________  LABS (pertinent  positives/negatives)  Metabolic panel without significant abnormalities White blood count 7.5, your bone 16.3 Urinalysis 3+ leukocytes, too numerous to count red blood cells, too numerous to count white blood cells, rare bacteria  ____________________________________________  RADIOLOGY All Xrays were viewed by me. Imaging interpreted by Radiologist.  Ultrasound renal: Normal no hydronephrosis __________________________________________  PROCEDURES  Procedure(s) performed: None Critical Care performed: None  ____________________________________________   ED COURSE /  ASSESSMENT AND PLAN  CONSULTATIONS: None  Pertinent labs & imaging results that were available during my care of the patient were reviewed by me and considered in my medical decision making (see chart for details).   Clinically I suspect pyelonephritis given the urinalysis and bilateral flank symptoms. Kidney stone is a consideration, however less likely with bilateral symptoms. Her kidney function is reassuring. Her white blood cell count is normal. I think she is stable for outpatient management.  Patient / Family / Caregiver informed of clinical course, medical decision-making process, and agree with plan.   I discussed return precautions, follow-up instructions, and discharged instructions with patient and/or family.  ___________________________________________   FINAL CLINICAL IMPRESSION(S) / ED DIAGNOSES   Final diagnoses:  Pyelonephritis  Ureteral colic    FOLLOW UP  Referred to: Primary care physician within one week   Governor Rooks, MD 01/26/15 [phone]

## 2015-01-28 LAB — URINE CULTURE: Culture: >=100000

## 2015-11-30 ENCOUNTER — Encounter: Payer: Self-pay | Admitting: Radiology

## 2015-11-30 ENCOUNTER — Emergency Department
Admission: EM | Admit: 2015-11-30 | Discharge: 2015-12-01 | Disposition: A | Discharge status: Home / Self Care | Payer: Medicare Other | Attending: Emergency Medicine | Admitting: Emergency Medicine

## 2015-11-30 ENCOUNTER — Emergency Department: Payer: Medicare Other

## 2015-11-30 DIAGNOSIS — Z79899 Other long term (current) drug therapy: Secondary | ICD-10-CM | POA: Insufficient documentation

## 2015-11-30 DIAGNOSIS — I10 Essential (primary) hypertension: Secondary | ICD-10-CM | POA: Diagnosis not present

## 2015-11-30 DIAGNOSIS — R109 Unspecified abdominal pain: Secondary | ICD-10-CM

## 2015-11-30 DIAGNOSIS — J45909 Unspecified asthma, uncomplicated: Secondary | ICD-10-CM | POA: Insufficient documentation

## 2015-11-30 DIAGNOSIS — R1084 Generalized abdominal pain: Secondary | ICD-10-CM | POA: Insufficient documentation

## 2015-11-30 DIAGNOSIS — F17210 Nicotine dependence, cigarettes, uncomplicated: Secondary | ICD-10-CM | POA: Diagnosis not present

## 2015-11-30 DIAGNOSIS — R1110 Vomiting, unspecified: Secondary | ICD-10-CM | POA: Diagnosis present

## 2015-11-30 HISTORY — DX: Systemic involvement of connective tissue, unspecified: M35.9

## 2015-11-30 HISTORY — DX: Essential (primary) hypertension: I10

## 2015-11-30 HISTORY — DX: Unspecified asthma, uncomplicated: J45.909

## 2015-11-30 LAB — COMPREHENSIVE METABOLIC PANEL WITH GFR
ALT: 45 U/L (ref 14–54)
AST: 26 U/L (ref 15–41)
Albumin: 4.2 g/dL (ref 3.5–5.0)
Alkaline Phosphatase: 75 U/L (ref 38–126)
Anion gap: 10 (ref 5–15)
BUN: 11 mg/dL (ref 6–20)
CO2: 24 mmol/L (ref 22–32)
Calcium: 8.9 mg/dL (ref 8.9–10.3)
Chloride: 103 mmol/L (ref 101–111)
Creatinine, Ser: 0.93 mg/dL (ref 0.44–1.00)
GFR calc Af Amer: >60 mL/min
GFR calc non Af Amer: >60 mL/min
Glucose, Bld: 165 mg/dL — ABNORMAL HIGH (ref 65–99)
Potassium: 3.8 mmol/L (ref 3.5–5.1)
Sodium: 137 mmol/L (ref 135–145)
Total Bilirubin: 0.7 mg/dL (ref 0.3–1.2)
Total Protein: 7.5 g/dL (ref 6.5–8.1)

## 2015-11-30 LAB — LIPASE, BLOOD: LIPASE: 19 U/L (ref 11–51)

## 2015-11-30 LAB — URINALYSIS COMPLETE WITH MICROSCOPIC (ARMC ONLY)
BILIRUBIN URINE: NEGATIVE
Bacteria, UA: NONE SEEN
Glucose, UA: NEGATIVE mg/dL
HGB URINE DIPSTICK: NEGATIVE
Ketones, ur: NEGATIVE mg/dL
LEUKOCYTES UA: NEGATIVE
Nitrite: NEGATIVE
PH: 5 (ref 5.0–8.0)
PROTEIN: NEGATIVE mg/dL
Specific Gravity, Urine: 1.018 (ref 1.005–1.030)

## 2015-11-30 LAB — CBC
HCT: 51.6 % — ABNORMAL HIGH (ref 35.0–47.0)
Hemoglobin: 17.5 g/dL — ABNORMAL HIGH (ref 12.0–16.0)
MCH: 30.4 pg (ref 26.0–34.0)
MCHC: 33.9 g/dL (ref 32.0–36.0)
MCV: 89.6 fL (ref 80.0–100.0)
PLATELETS: 193 10*3/uL (ref 150–440)
RBC: 5.76 MIL/uL — AB (ref 3.80–5.20)
RDW: 12.7 % (ref 11.5–14.5)
WBC: 6.5 10*3/uL (ref 3.6–11.0)

## 2015-11-30 MED ORDER — HYDROMORPHONE HCL 1 MG/ML IJ SOLN
1.0000 mg | Freq: Once | INTRAMUSCULAR | Status: AC
  Administered 2015-11-30: 1 mg via INTRAVENOUS
  Filled 2015-11-30: qty 1

## 2015-11-30 MED ORDER — SODIUM CHLORIDE 0.9 % IV BOLUS (SEPSIS)
1000.0000 mL | Freq: Once | INTRAVENOUS | Status: AC
  Administered 2015-11-30: 1000 mL via INTRAVENOUS

## 2015-11-30 MED ORDER — ONDANSETRON HCL 4 MG/2ML IJ SOLN
4.0000 mg | Freq: Once | INTRAMUSCULAR | Status: AC
  Administered 2015-11-30: 4 mg via INTRAVENOUS
  Filled 2015-11-30: qty 2

## 2015-11-30 MED ORDER — ONDANSETRON HCL 4 MG/2ML IJ SOLN
INTRAMUSCULAR | Status: AC
  Administered 2015-11-30: 4 mg via INTRAVENOUS
  Filled 2015-11-30: qty 2

## 2015-11-30 MED ORDER — DIATRIZOATE MEGLUMINE & SODIUM 66-10 % PO SOLN
15.0000 mL | Freq: Once | ORAL | Status: AC
  Administered 2015-11-30: 15 mL via ORAL

## 2015-11-30 MED ORDER — MORPHINE SULFATE (PF) 4 MG/ML IV SOLN
4.0000 mg | Freq: Once | INTRAVENOUS | Status: AC
  Administered 2015-11-30: 4 mg via INTRAVENOUS

## 2015-11-30 MED ORDER — IOPAMIDOL (ISOVUE-300) INJECTION 61%
100.0000 mL | Freq: Once | INTRAVENOUS | Status: AC | PRN
  Administered 2015-11-30: 100 mL via INTRAVENOUS

## 2015-11-30 MED ORDER — MORPHINE SULFATE (PF) 4 MG/ML IV SOLN
INTRAVENOUS | Status: AC
  Administered 2015-11-30: 4 mg via INTRAVENOUS
  Filled 2015-11-30: qty 1

## 2015-11-30 MED ORDER — ONDANSETRON HCL 4 MG/2ML IJ SOLN
4.0000 mg | Freq: Once | INTRAMUSCULAR | Status: AC
  Administered 2015-11-30: 4 mg via INTRAVENOUS

## 2015-11-30 NOTE — ED Notes (Signed)
Pt in with co upper abd pain since Sunday has been vomiting denies any diarrhea or dysuria.

## 2015-11-30 NOTE — ED Provider Notes (Signed)
[facility] Emergency Department Provider Note  ____________________________________________  Time seen: 11:15 PM  I have reviewed the triage vital signs and the nursing notes.   HISTORY  Chief Complaint Emesis      HPI Stacey Beck is a 55 y.o. female with history of multiple abdominal surgeries including hysterectomy cholecystectomy tubal ligation presents with generalized abdominal discomfort and is currently 10 out of 10 accompanied by vomiting 3 days patient states last bowel movement was 3 days ago. Patient states she took her lactulose as prescribed however without results. Patient admits to "low-grade fever at home which was subjective.     Past Medical History  Diagnosis Date  . Kidney stone     Patient Active Problem List   Diagnosis Date Noted  . COUGH 04/12/2008  . TOBACCO ABUSE 12/03/2007  . DYSPNEA 12/03/2007  . HYPERTENSION 11/04/2007  . ASTHMA 11/04/2007  . COPD 11/04/2007  . HYPERSOMNIA WITH SLEEP APNEA UNSPECIFIED 11/04/2007    Past Surgical History  Procedure Laterality Date  . Abdominal hysterectomy    . Cholecystectomy    . Hernia repair    . Tubal ligation      Current Outpatient Rx  Name  Route  Sig  Dispense  Refill  . albuterol (PROVENTIL HFA;VENTOLIN HFA) 108 (90 BASE) MCG/ACT inhaler   Inhalation   Inhale 2 puffs into the lungs every 4 (four) hours as needed for wheezing or shortness of breath.         . ALPRAZolam (XANAX) 1 MG tablet   Oral   Take 1 mg by mouth 3 (three) times daily as needed for anxiety.         Marland Kitchen buPROPion (WELLBUTRIN XL) 150 MG 24 hr tablet   Oral   Take 150 mg by mouth daily.         . cyclobenzaprine (FLEXERIL) 10 MG tablet   Oral   Take 10 mg by mouth 3 (three) times daily as needed for muscle spasms.         . fluticasone (FLONASE) 50 MCG/ACT nasal spray   Each Nare   Place 1 spray into both nostrils daily.         . Fluticasone-Salmeterol (ADVAIR) 250-50 MCG/DOSE  AEPB   Inhalation   Inhale 1 puff into the lungs 2 (two) times daily.         . formoterol (PERFOROMIST) 20 MCG/2ML nebulizer solution   Nebulization   Take 20 mcg by nebulization 2 (two) times daily.         Marland Kitchen gabapentin (NEURONTIN) 400 MG capsule   Oral   Take 400 mg by mouth 3 (three) times daily.         Marland Kitchen ketorolac (TORADOL) 10 MG tablet   Oral   Take 1 tablet (10 mg total) by mouth every 8 (eight) hours as needed for moderate pain.   15 tablet   0   . lisinopril (PRINIVIL,ZESTRIL) 10 MG tablet   Oral   Take 10 mg by mouth daily.         Marland Kitchen omeprazole (PRILOSEC) 20 MG capsule   Oral   Take 20 mg by mouth daily.         Marland Kitchen oxyCODONE-acetaminophen (PERCOCET/ROXICET) 5-325 MG per tablet   Oral   Take 1-2 tablets by mouth once.   10 tablet   0   . oxyCODONE-acetaminophen (ROXICET) 5-325 MG per tablet   Oral   Take 1-2 tablets by mouth every 6 (six) hours  as needed for severe pain.   10 tablet   0   . sulfamethoxazole-trimethoprim (BACTRIM DS,SEPTRA DS) 800-160 MG per tablet   Oral   Take 1 tablet by mouth 2 (two) times daily.   20 tablet   0   . tiotropium (SPIRIVA) 18 MCG inhalation capsule   Inhalation   Place 18 mcg into inhaler and inhale daily.         . traMADol (ULTRAM) 50 MG tablet   Oral   Take 50 mg by mouth every 6 (six) hours as needed.           Allergies No known drug allergies No family history on file.  Social History Social History  Substance Use Topics  . Smoking status: Current Every Day Smoker -- 1.00 packs/day    Types: Cigarettes  . Smokeless tobacco: Not on file  . Alcohol Use: No    Review of Systems  Constitutional: Negative for fever. Eyes: Negative for visual changes. ENT: Negative for sore throat. Cardiovascular: Negative for chest pain. Respiratory: Negative for shortness of breath. Gastrointestinal: Positive for abdominal pain and vomiting Genitourinary: Negative for dysuria. Musculoskeletal:  Negative for back pain. Skin: Negative for rash. Neurological: Negative for headaches, focal weakness or numbness.   10-point ROS otherwise negative.  ____________________________________________   PHYSICAL EXAM:  VITAL SIGNS: ED Triage Vitals  Enc Vitals Group     BP 11/30/15 2027 158/100 mmHg     Pulse Rate 11/30/15 2027 87     Resp 11/30/15 2027 18     Temp 11/30/15 2027 98 F (36.7 C)     Temp Source 11/30/15 2027 Oral     SpO2 11/30/15 2027 95 %     Weight 11/30/15 2027 270 lb (122.471 kg)     Height 11/30/15 2027 5\' 7"  (1.702 m)     Head Cir --      Peak Flow --      Pain Score 11/30/15 2027 9     Pain Loc --      Pain Edu? --      Excl. in GC? --      Constitutional: Alert and oriented. Well appearing and in no distress. Eyes: Conjunctivae are normal. PERRL. Normal extraocular movements. ENT   Head: Normocephalic and atraumatic.   Nose: No congestion/rhinnorhea.   Mouth/Throat: Mucous membranes are moist.   Neck: No stridor. Hematological/Lymphatic/Immunilogical: No cervical lymphadenopathy. Cardiovascular: Normal rate, regular rhythm. Normal and symmetric distal pulses are present in all extremities. No murmurs, rubs, or gallops. Respiratory: Normal respiratory effort without tachypnea nor retractions. Breath sounds are clear and equal bilaterally. No wheezes/rales/rhonchi. Gastrointestinal: Generalized abdominal pain to palpation. No distention. There is no CVA tenderness. Genitourinary: deferred Musculoskeletal: Nontender with normal range of motion in all extremities. No joint effusions.  No lower extremity tenderness nor edema. Neurologic:  Normal speech and language. No gross focal neurologic deficits are appreciated. Speech is normal.  Skin:  Skin is warm, dry and intact. No rash noted. Psychiatric: Mood and affect are normal. Speech and behavior are normal. Patient exhibits appropriate insight and  judgment.  ____________________________________________    LABS (pertinent positives/negatives)  Labs Reviewed  CBC - Abnormal; Notable for the following:    RBC 5.76 (*)    Hemoglobin 17.5 (*)    HCT 51.6 (*)    All other components within normal limits  COMPREHENSIVE METABOLIC PANEL - Abnormal; Notable for the following:    Glucose, Bld 165 (*)    All other components  within normal limits  URINALYSIS COMPLETEWITH MICROSCOPIC ([facility] ONLY) - Abnormal; Notable for the following:    Color, Urine YELLOW (*)    APPearance HAZY (*)    Squamous Epithelial / LPF 6-30 (*)    All other components within normal limits  LIPASE, BLOOD     ____________________________________________   EKG  ED ECG REPORT I, Heflin N BROWN, the attending physician, personally viewed and interpreted this ECG.   Date: 12/02/2015  EKG Time: 8:33 PM  Rate: 85  Rhythm: Normal sinus rhythm  Axis: Normal  Intervals: Normal  ST&T Change: None   ____________________________________________    RADIOLOGY  CT Abdomen Pelvis W Contrast (Final result) Result time: 12/01/15 00:03:53   Final result by Rad Results In Interface (12/01/15 00:03:53)   Narrative:   CLINICAL DATA: Generalized abdominal pain and vomiting.  EXAM: CT ABDOMEN AND PELVIS WITH CONTRAST  TECHNIQUE: Multidetector CT imaging of the abdomen and pelvis was performed using the standard protocol following bolus administration of intravenous contrast.  CONTRAST: 100mL ISOVUE-300 IOPAMIDOL (ISOVUE-300) INJECTION 61%  COMPARISON: 07/15/2013  FINDINGS: The lung bases are clear.  Diffuse fatty infiltration of the liver. No focal liver lesions identified. Surgical absence of the gallbladder. No bile duct dilatation. The pancreas, spleen, adrenal glands, kidneys, abdominal aorta, inferior vena cava, and retroperitoneal lymph nodes are unremarkable. Stomach, small bowel, and colon are not abnormally distended. No free air or  free fluid in the abdomen.  Pelvis: The appendix is normal. Uterus is surgically absent. Ovaries are not enlarged. Bladder wall is not thickened. No pelvic mass or lymphadenopathy. No free or loculated pelvic fluid collections. No destructive bone lesions. Degenerative changes in the spine.  IMPRESSION: No acute process demonstrated in the abdomen or pelvis. No evidence of bowel obstruction. Diffuse fatty infiltration of the liver.   Electronically Signed By: Burman NievesWilliam Stevens M.D. On: 12/01/2015 00:03        INITIAL IMPRESSION / ASSESSMENT AND PLAN / ED COURSE  Pertinent labs & imaging results that were available during my care of the patient were reviewed by me and considered in my medical decision making (see chart for details).  No clear etiology for the patient's abdominal pain identified as such patient will be referred to gastroenterology for further outpatient evaluation ____________________________________________   FINAL CLINICAL IMPRESSION(S) / ED DIAGNOSES  Final diagnoses:  Abdominal pain, unspecified abdominal location      Darci Currentandolph N Brown, MD 12/02/15 [phone]

## 2015-12-01 MED ORDER — DICYCLOMINE HCL 20 MG PO TABS: 20.0000 mg | ORAL_TABLET | Freq: Three times a day (TID) | ORAL | Status: DC | PRN

## 2015-12-01 MED ORDER — ONDANSETRON 4 MG PO TBDP: 4.0000 mg | ORAL_TABLET | Freq: Three times a day (TID) | ORAL | Status: DC | PRN

## 2015-12-01 NOTE — Discharge Instructions (Signed)

## 2015-12-01 NOTE — ED Notes (Signed)
MD at bedside to update pt on results of CT scan and plan of care.

## 2016-02-08 ENCOUNTER — Encounter: Payer: Self-pay | Admitting: Gastroenterology

## 2016-02-08 ENCOUNTER — Ambulatory Visit (INDEPENDENT_AMBULATORY_CARE_PROVIDER_SITE_OTHER): Payer: Medicare Other | Admitting: Gastroenterology

## 2016-02-08 VITALS — BP 137/76 | HR 85 | Temp 97.9°F | Ht 67.0 in | Wt 273.0 lb

## 2016-02-08 DIAGNOSIS — K5900 Constipation, unspecified: Secondary | ICD-10-CM

## 2016-02-08 DIAGNOSIS — R1033 Periumbilical pain: Secondary | ICD-10-CM | POA: Diagnosis not present

## 2016-02-08 DIAGNOSIS — K760 Fatty (change of) liver, not elsewhere classified: Secondary | ICD-10-CM

## 2016-02-08 NOTE — Progress Notes (Signed)
Gastroenterology Consultation  Referring Provider:     [facility] [place] [facility] Primary Gastroenterologist:  Dr. Servando SnareWohl     Reason for Consultation:     Constipation and fatty liver        HPI:   Stacey Beck is a 55 y.o. y/o female referred for consultation & management of Constipation and fatty liver [facility]  Patient comes today with a history of constipation. The patient reports that she moves her bowels only once a week. The patient was in the emergency room for abdominal pain that she relates to a mesh she had placed for her hernia. The patient states that it hurts right where the mesh is. She also states that when she sits up or lesions felt the hernia has recurred. She states that when she coughs or strains to have a bowel movement she has protruding of the hernia which makes her pain worse. The patient was also found to have fatty liver on her CT scan and was sent to me for evaluation.  Past Medical History:  Diagnosis Date  . Asthma   . Collagen vascular disease (HCC)   . Hypertension   . Kidney stone     Past Surgical History:  Procedure Laterality Date  . ABDOMINAL HYSTERECTOMY    . CHOLECYSTECTOMY    . HERNIA REPAIR    . TUBAL LIGATION      Prior to Admission medications   Medication Sig Start Date End Date Taking? Authorizing Provider  albuterol (PROVENTIL HFA;VENTOLIN HFA) 108 (90 BASE) MCG/ACT inhaler Inhale 2 puffs into the lungs every 4 (four) hours as needed for wheezing or shortness of breath.   Yes Historical Provider, MD  ALPRAZolam Prudy Feeler(XANAX) 1 MG tablet Take 1 mg by mouth 3 (three) times daily as needed for anxiety.   Yes Historical Provider, MD  buPROPion (WELLBUTRIN XL) 150 MG 24 hr tablet Take 150 mg by mouth daily.   Yes Historical Provider, MD  dicyclomine (BENTYL) 20 MG tablet Take 1 tablet (20 mg total) by mouth 3 (three) times daily as needed for spasms. 12/01/15 11/30/16 Yes Darci Currentandolph N Brown, MD   fluticasone (FLONASE) 50 MCG/ACT nasal spray Place 1 spray into both nostrils daily.   Yes Historical Provider, MD  Fluticasone-Salmeterol (ADVAIR) 250-50 MCG/DOSE AEPB Inhale 1 puff into the lungs 2 (two) times daily.   Yes Historical Provider, MD  gabapentin (NEURONTIN) 400 MG capsule Take 400 mg by mouth 3 (three) times daily.   Yes Historical Provider, MD  lisinopril (PRINIVIL,ZESTRIL) 10 MG tablet Take 10 mg by mouth daily.   Yes Historical Provider, MD  omeprazole (PRILOSEC) 20 MG capsule Take 20 mg by mouth daily.   Yes Historical Provider, MD  ondansetron (ZOFRAN ODT) 4 MG disintegrating tablet Take 1 tablet (4 mg total) by mouth every 8 (eight) hours as needed for nausea or vomiting. 12/01/15  Yes Darci Currentandolph N Brown, MD  tiotropium (SPIRIVA) 18 MCG inhalation capsule Place 18 mcg into inhaler and inhale daily.   Yes Historical Provider, MD  traMADol (ULTRAM) 50 MG tablet Take 50 mg by mouth every 6 (six) hours as needed.   Yes Historical Provider, MD  cyclobenzaprine (FLEXERIL) 10 MG tablet Take 10 mg by mouth 3 (three) times daily as needed for muscle spasms.    Historical Provider, MD  formoterol (PERFOROMIST) 20 MCG/2ML nebulizer solution Take 20 mcg by nebulization 2 (two) times daily.    Historical Provider, MD  ketorolac (TORADOL) 10 MG tablet Take 1 tablet (10 mg total) by mouth every 8 (eight) hours as needed for moderate pain. Patient not taking: Reported on 02/08/2016 01/26/15   Governor Rooks, MD  oxyCODONE-acetaminophen (PERCOCET/ROXICET) 5-325 MG per tablet Take 1-2 tablets by mouth once. Patient not taking: Reported on 02/08/2016 01/26/15   Governor Rooks, MD  oxyCODONE-acetaminophen (ROXICET) 5-325 MG per tablet Take 1-2 tablets by mouth every 6 (six) hours as needed for severe pain. Patient not taking: Reported on 02/08/2016 01/26/15   Governor Rooks, MD  sulfamethoxazole-trimethoprim (BACTRIM DS,SEPTRA DS) 800-160 MG per tablet Take 1 tablet by mouth 2 (two) times daily. Patient not  taking: Reported on 02/08/2016 01/26/15   Governor Rooks, MD    Family History  Problem Relation Age of Onset  . Heart disease Father   . Diabetes Father   . Diabetes Son      Social History  Substance Use Topics  . Smoking status: Current Every Day Smoker    Packs/day: 1.00    Types: Cigarettes  . Smokeless tobacco: Never Used  . Alcohol use No    Allergies as of 02/08/2016  . (No Known Allergies)    Review of Systems:    All systems reviewed and negative except where noted in HPI.   Physical Exam:  BP 137/76   Pulse 85   Temp 97.9 F (36.6 C) (Oral)   Ht 5\' 7"  (1.702 m)   Wt 273 lb (123.8 kg)   BMI 42.76 kg/m  No LMP recorded. Patient has had a hysterectomy. Psych:  Alert and cooperative. Normal mood and affect. General:   Alert,  Well-developed, Morbid obesity, well-nourished, pleasant and cooperative in NAD Head:  Normocephalic and atraumatic. Eyes:  Sclera clear, no icterus.   Conjunctiva pink. Ears:  Normal auditory acuity. Nose:  No deformity, discharge, or lesions. Mouth:  No deformity or lesions,oropharynx pink & moist. Neck:  Supple; no masses or thyromegaly. Lungs:  Respirations even and unlabored.  Clear throughout to auscultation.   No wheezes, crackles, or rhonchi. No acute distress. Heart:  Regular rate and rhythm; no murmurs, clicks, rubs, or gallops. Abdomen:  Normal bowel sounds.  No bruits.  Soft, non-tender and non-distended without masses, hepatosplenomegaly. An umbilical hernias was noted.  No guarding or rebound tenderness.  Negative Carnett sign.   Rectal:  Deferred.  Msk:  Symmetrical without gross deformities.  Good, equal movement & strength bilaterally. Pulses:  Normal pulses noted. Extremities:  No clubbing or edema.  No cyanosis. Neurologic:  Alert and oriented x3;  grossly normal neurologically. Skin:  Intact without significant lesions or rashes.  No jaundice. Lymph Nodes:  No significant cervical adenopathy. Psych:  Alert and  cooperative. Normal mood and affect.  Imaging Studies: No results found.  Assessment and Plan:   Stacey Beck is a 55 y.o. y/o female who was found to have fatty liver on her CT scan in the ER. The patient was told that she should lose weight to decrease the amount of fat in her liver. The patient's liver enzymes are normal but she has been told that with fatty liver and a can change and she can end up with cirrhosis that she does not take care of this. The patient has also been tried on a trial of Linzess 290 g per day for her constipation. The patient will be set up for a evaluation with a surgeon for her hernia and possible mesh failure. The patient had a colonoscopy 2 years ago  by Dr. Shelle Ironein due to a family history of colon polyps. She does not need a repeat colonoscopy at this time   Note: This dictation was prepared with Dragon dictation along with smaller phrase technology. Any transcriptional errors that result from this process are unintentional.

## 2016-02-09 ENCOUNTER — Ambulatory Visit (INDEPENDENT_AMBULATORY_CARE_PROVIDER_SITE_OTHER): Payer: Medicare Other | Admitting: General Surgery

## 2016-02-09 ENCOUNTER — Encounter: Payer: Self-pay | Admitting: General Surgery

## 2016-02-09 VITALS — BP 162/85 | HR 76 | Temp 98.0°F | Ht 67.0 in | Wt 274.0 lb

## 2016-02-09 DIAGNOSIS — M6208 Separation of muscle (nontraumatic), other site: Secondary | ICD-10-CM | POA: Diagnosis not present

## 2016-02-09 NOTE — Patient Instructions (Signed)
You have a diastasis recti that is causing the bulge in your abdomen. This occurs when the muscles in the abdomen slide to the sides of your body and allow your abdominal contents to put pressure on your abdominal wall. We cannot fix this unfortunately.  Weight Loss will help this and as you have requested we will place the referral to Dr. Allayne Butcheryner's Office so that you may speak with them about weight loss surgery. We will call you with your appointment information as soon as this is available.  You have a very very small umbilical hernia that was seen on CT. The only reason that this would need to be repaired is if this is causing you severe pain and today during your exam, this does not seem to bother you much.

## 2016-02-09 NOTE — Progress Notes (Signed)
Patient ID: Stacey Beck, female   DOB: 1960-10-24, 55 y.o.   MRN: [id number]  CC: ABDOMINAL PAIN  HPI Stacey Beck is a 55 y.o. female presents to clinic today for evaluation of abdominal pain. She is in the ER for this and told that "her hernia had busted through her hernia mesh". Patient reports a large area of bulge to her upper midline when she sits up from laying down. She reports worsening pain and has been taking pain medication. She does have a prior history of multiple abdominal surgeries including a ventral hernia repair near the area but reports that this is different in that the pain runs on bilateral upper quadrant along the rectus muscles. She also has a history of constipation this being treated with plans us she reports is improved. She currently denies any fevers, chills, nausea, vomiting, chest pain, shortness of breath, diarrhea, constipation. She also reports continuing to gain weight despite diet.  HPI  Past Medical History:  Diagnosis Date  . Asthma   . Collagen vascular disease (HCC)   . Hypertension   . Kidney stone     Past Surgical History:  Procedure Laterality Date  . ABDOMINAL HYSTERECTOMY    . CHOLECYSTECTOMY    . HERNIA REPAIR    . TUBAL LIGATION      Family History  Problem Relation Age of Onset  . Heart disease Father   . Diabetes Father   . Diabetes Son     Social History Social History  Substance Use Topics  . Smoking status: Current Every Day Smoker    Packs/day: 1.00    Types: Cigarettes  . Smokeless tobacco: Never Used  . Alcohol use No    No Known Allergies  Current Outpatient Prescriptions  Medication Sig Dispense Refill  . albuterol (PROVENTIL HFA;VENTOLIN HFA) 108 (90 BASE) MCG/ACT inhaler Inhale 2 puffs into the lungs every 4 (four) hours as needed for wheezing or shortness of breath.    . ALPRAZolam (XANAX) 1 MG tablet Take 1 mg by mouth 3 (three) times daily as needed for anxiety.    Marland Kitchen. buPROPion (WELLBUTRIN XL) 150 MG 24 hr  tablet Take 150 mg by mouth daily.    . cyclobenzaprine (FLEXERIL) 10 MG tablet Take 10 mg by mouth 3 (three) times daily as needed for muscle spasms.    Marland Kitchen. dicyclomine (BENTYL) 20 MG tablet Take 1 tablet (20 mg total) by mouth 3 (three) times daily as needed for spasms. 30 tablet 0  . fluticasone (FLONASE) 50 MCG/ACT nasal spray Place 1 spray into both nostrils daily.    . Fluticasone-Salmeterol (ADVAIR) 250-50 MCG/DOSE AEPB Inhale 1 puff into the lungs 2 (two) times daily.    . formoterol (PERFOROMIST) 20 MCG/2ML nebulizer solution Take 20 mcg by nebulization 2 (two) times daily.    Marland Kitchen. gabapentin (NEURONTIN) 400 MG capsule Take 400 mg by mouth 3 (three) times daily.    Marland Kitchen. ketorolac (TORADOL) 10 MG tablet Take 1 tablet (10 mg total) by mouth every 8 (eight) hours as needed for moderate pain. (Patient not taking: Reported on 02/08/2016) 15 tablet 0  . lisinopril (PRINIVIL,ZESTRIL) 10 MG tablet Take 10 mg by mouth daily.    Marland Kitchen. omeprazole (PRILOSEC) 20 MG capsule Take 20 mg by mouth daily.    . ondansetron (ZOFRAN ODT) 4 MG disintegrating tablet Take 1 tablet (4 mg total) by mouth every 8 (eight) hours as needed for nausea or vomiting. 20 tablet 0  . oxyCODONE-acetaminophen (PERCOCET/ROXICET) 5-325 MG  per tablet Take 1-2 tablets by mouth once. (Patient not taking: Reported on 02/08/2016) 10 tablet 0  . oxyCODONE-acetaminophen (ROXICET) 5-325 MG per tablet Take 1-2 tablets by mouth every 6 (six) hours as needed for severe pain. (Patient not taking: Reported on 02/08/2016) 10 tablet 0  . sulfamethoxazole-trimethoprim (BACTRIM DS,SEPTRA DS) 800-160 MG per tablet Take 1 tablet by mouth 2 (two) times daily. (Patient not taking: Reported on 02/08/2016) 20 tablet 0  . tiotropium (SPIRIVA) 18 MCG inhalation capsule Place 18 mcg into inhaler and inhale daily.    . traMADol (ULTRAM) 50 MG tablet Take 50 mg by mouth every 6 (six) hours as needed.     No current facility-administered medications for this visit.       Review of Systems A Multi-point review of systems was asked and was negative except for the findings documented in the history of present illness  Physical Exam VITALS: T 98.0; BP 162/85, HR 76, Ht 5'7", Wt 274 CONSTITUTIONAL: No acute distress. EYES: Pupils are equal, round, and reactive to light, Sclera are non-icteric. EARS, NOSE, MOUTH AND THROAT: The oropharynx is clear. The oral mucosa is pink and moist. Hearing is intact to voice. LYMPH NODES:  Lymph nodes in the neck are normal. RESPIRATORY:  Lungs are clear. There is normal respiratory effort, with equal breath sounds bilaterally, and without pathologic use of accessory muscles. CARDIOVASCULAR: Heart is regular without murmurs, gallops, or rubs. GI: The abdomen is very large, soft, mildly tender to deep palpation in the bilateral rectus muscles, and nondistended. There are no palpable masses. There is no hepatosplenomegaly. There are normal bowel sounds in all quadrants. Multiple well-healed incision sites, no obvious hernia. Diastases recti appreciated with patient sitting up. GU: Rectal deferred.   MUSCULOSKELETAL: Normal muscle strength and tone. No cyanosis or edema.   SKIN: Turgor is good and there are no pathologic skin lesions or ulcers. NEUROLOGIC: Motor and sensation is grossly normal. Cranial nerves are grossly intact. PSYCH:  Oriented to person, place and time. Affect is normal.  Data Reviewed CT scan 2 months ago reviewed which shows an intact fascia to the area of concern and a small incisional hernia below the umbilicus. I have personally reviewed the patient's imaging, laboratory findings and medical records.    Assessment    Diastases recti, morbid obesity, incisional hernia    Plan    1. Rectus diastasis Patient's primary complaint of pain and bulge is associated diastases recti. Her small incisional hernia below her umbilicus is nontender and is not of concern. Discussed the patient in detail that  diastases recti is not a hernia does not carry the same risks as a hernia and cannot be repaired through a standard hernia repair. Report of this is primarily done to plastic surgery at the setting of an abdominoplasty with a rectus muscle plication. She voiced understanding of this as be done to plastic surgery and is normally not offered until she has lost weight.  2. Morbid obesity, unspecified obesity type (HCC) Patient reports worsening weight gain in spite of dilating. Discussed the possibilities of bariatric surgery with her as this could help with her rectus diastases. She voiced an interest and a desire for further evaluation for bariatric surgery. We will provide her with a consult today to our local bariatric surgeons. All questions answered to her satisfaction.      Time spent with the patient was 45 minutes, with more than 50% of the time spent in face-to-face education, counseling and care coordination.  Ricarda Frameharles Alleene Stoy, MD FACS General Surgeon 02/09/2016, 10:41 AM

## 2016-02-15 ENCOUNTER — Telehealth: Payer: Self-pay | Admitting: General Surgery

## 2016-02-15 NOTE — Telephone Encounter (Signed)
[facility] Dr Allayne Butcheryner's office, @ [phone] for Bariatric Surgery.  I have called Dr Allayne Butcheryner's office and spoke with Sue LushAndrea. She obtained all the patient's Demographic information. Sue Lushndrea stated that she would contact the patient to advise her of the online seminar that she would need to do prior to the first initial appointment.   I have called the patient to advise her of this information and she stated that she had just got off the phone with Dr Allayne Butcheryner's office and was given all the information.   I advised the patient to contact us for anything in the future.

## 2016-10-14 ENCOUNTER — Encounter: Payer: Self-pay | Admitting: Emergency Medicine

## 2016-10-14 ENCOUNTER — Emergency Department: Payer: No Typology Code available for payment source

## 2016-10-14 ENCOUNTER — Emergency Department
Admission: EM | Admit: 2016-10-14 | Discharge: 2016-10-14 | Disposition: A | Discharge status: Nursing Home (Includes ICF) | Payer: No Typology Code available for payment source | Attending: Emergency Medicine | Admitting: Emergency Medicine

## 2016-10-14 DIAGNOSIS — Y9389 Activity, other specified: Secondary | ICD-10-CM | POA: Insufficient documentation

## 2016-10-14 DIAGNOSIS — Y92410 Unspecified street and highway as the place of occurrence of the external cause: Secondary | ICD-10-CM | POA: Insufficient documentation

## 2016-10-14 DIAGNOSIS — S32401A Unspecified fracture of right acetabulum, initial encounter for closed fracture: Secondary | ICD-10-CM

## 2016-10-14 DIAGNOSIS — S32421A Displaced fracture of posterior wall of right acetabulum, initial encounter for closed fracture: Secondary | ICD-10-CM | POA: Diagnosis not present

## 2016-10-14 DIAGNOSIS — J45909 Unspecified asthma, uncomplicated: Secondary | ICD-10-CM | POA: Insufficient documentation

## 2016-10-14 DIAGNOSIS — I10 Essential (primary) hypertension: Secondary | ICD-10-CM | POA: Diagnosis not present

## 2016-10-14 DIAGNOSIS — S62341A Nondisplaced fracture of base of second metacarpal bone. left hand, initial encounter for closed fracture: Secondary | ICD-10-CM

## 2016-10-14 DIAGNOSIS — S0181XA Laceration without foreign body of other part of head, initial encounter: Secondary | ICD-10-CM | POA: Diagnosis not present

## 2016-10-14 DIAGNOSIS — R51 Headache: Secondary | ICD-10-CM | POA: Insufficient documentation

## 2016-10-14 DIAGNOSIS — J449 Chronic obstructive pulmonary disease, unspecified: Secondary | ICD-10-CM | POA: Diagnosis not present

## 2016-10-14 DIAGNOSIS — F17210 Nicotine dependence, cigarettes, uncomplicated: Secondary | ICD-10-CM | POA: Insufficient documentation

## 2016-10-14 DIAGNOSIS — Y999 Unspecified external cause status: Secondary | ICD-10-CM | POA: Insufficient documentation

## 2016-10-14 DIAGNOSIS — S73004A Unspecified dislocation of right hip, initial encounter: Secondary | ICD-10-CM | POA: Insufficient documentation

## 2016-10-14 DIAGNOSIS — Z79899 Other long term (current) drug therapy: Secondary | ICD-10-CM | POA: Diagnosis not present

## 2016-10-14 DIAGNOSIS — S6992XA Unspecified injury of left wrist, hand and finger(s), initial encounter: Secondary | ICD-10-CM | POA: Diagnosis present

## 2016-10-14 LAB — BASIC METABOLIC PANEL
Anion gap: 8 (ref 5–15)
BUN: 17 mg/dL (ref 6–20)
CALCIUM: 8.5 mg/dL — AB (ref 8.9–10.3)
CHLORIDE: 104 mmol/L (ref 101–111)
CO2: 27 mmol/L (ref 22–32)
Creatinine, Ser: 0.93 mg/dL (ref 0.44–1.00)
GFR calc Af Amer: >60 mL/min (ref >60)
GFR calc non Af Amer: >60 mL/min (ref >60)
GLUCOSE: 224 mg/dL — AB (ref 65–99)
Potassium: 4.4 mmol/L (ref 3.5–5.1)
Sodium: 139 mmol/L (ref 135–145)

## 2016-10-14 LAB — CBC
HEMATOCRIT: 48.6 % — AB (ref 35.0–47.0)
HEMOGLOBIN: 16.5 g/dL — AB (ref 12.0–16.0)
MCH: 31.2 pg (ref 26.0–34.0)
MCHC: 34 g/dL (ref 32.0–36.0)
MCV: 91.7 fL (ref 80.0–100.0)
Platelets: 184 10*3/uL (ref 150–440)
RBC: 5.3 MIL/uL — AB (ref 3.80–5.20)
RDW: 12.9 % (ref 11.5–14.5)
WBC: 11.5 10*3/uL — ABNORMAL HIGH (ref 3.6–11.0)

## 2016-10-14 MED ORDER — FENTANYL CITRATE (PF) 100 MCG/2ML IJ SOLN
100.0000 ug | Freq: Once | INTRAMUSCULAR | Status: AC
  Administered 2016-10-14: 100 ug via INTRAVENOUS

## 2016-10-14 MED ORDER — HYDROMORPHONE HCL 1 MG/ML IJ SOLN
1.0000 mg | Freq: Once | INTRAMUSCULAR | Status: AC
  Administered 2016-10-14: 1 mg via INTRAVENOUS

## 2016-10-14 MED ORDER — HYDROMORPHONE HCL 1 MG/ML IJ SOLN
INTRAMUSCULAR | Status: AC
  Filled 2016-10-14: qty 1

## 2016-10-14 MED ORDER — HYDROMORPHONE HCL 1 MG/ML IJ SOLN
1.0000 mg | Freq: Once | INTRAMUSCULAR | Status: AC
  Administered 2016-10-14: 1 mg via INTRAVENOUS
  Filled 2016-10-14: qty 1

## 2016-10-14 MED ORDER — FENTANYL CITRATE (PF) 100 MCG/2ML IJ SOLN
INTRAMUSCULAR | Status: AC
  Administered 2016-10-14: 100 ug via INTRAVENOUS
  Filled 2016-10-14: qty 2

## 2016-10-14 MED ORDER — HYDROMORPHONE HCL 1 MG/ML IJ SOLN
INTRAMUSCULAR | Status: AC
  Administered 2016-10-14: 1 mg via INTRAVENOUS
  Filled 2016-10-14: qty 1
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
## 2016-10-14 NOTE — ED Notes (Signed)
Returned from XR 
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
## 2016-10-14 NOTE — ED Notes (Signed)
Pt placed on 2L O2 via n/c for O2 sat 91%. Hx COPD.
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
## 2018-04-02 ENCOUNTER — Emergency Department
Admission: EM | Admit: 2018-04-02 | Discharge: 2018-04-02 | Disposition: A | Discharge status: Home / Self Care | Payer: Medicare HMO | Attending: Emergency Medicine | Admitting: Emergency Medicine

## 2018-04-02 ENCOUNTER — Other Ambulatory Visit: Payer: Self-pay

## 2018-04-02 ENCOUNTER — Emergency Department: Payer: Medicare HMO

## 2018-04-02 ENCOUNTER — Encounter: Payer: Self-pay | Admitting: Emergency Medicine

## 2018-04-02 DIAGNOSIS — M25511 Pain in right shoulder: Secondary | ICD-10-CM

## 2018-04-02 DIAGNOSIS — J45909 Unspecified asthma, uncomplicated: Secondary | ICD-10-CM | POA: Insufficient documentation

## 2018-04-02 DIAGNOSIS — I10 Essential (primary) hypertension: Secondary | ICD-10-CM | POA: Insufficient documentation

## 2018-04-02 DIAGNOSIS — Z9049 Acquired absence of other specified parts of digestive tract: Secondary | ICD-10-CM | POA: Insufficient documentation

## 2018-04-02 DIAGNOSIS — E119 Type 2 diabetes mellitus without complications: Secondary | ICD-10-CM | POA: Insufficient documentation

## 2018-04-02 DIAGNOSIS — Z79899 Other long term (current) drug therapy: Secondary | ICD-10-CM | POA: Insufficient documentation

## 2018-04-02 DIAGNOSIS — F17210 Nicotine dependence, cigarettes, uncomplicated: Secondary | ICD-10-CM | POA: Insufficient documentation

## 2018-04-02 HISTORY — DX: Type 2 diabetes mellitus without complications: E11.9

## 2018-04-02 LAB — GLUCOSE, CAPILLARY: GLUCOSE-CAPILLARY: 162 mg/dL — AB (ref 70–99)

## 2018-04-02 MED ORDER — HYDROCODONE-ACETAMINOPHEN 5-325 MG PO TABS: 1.0000 | ORAL_TABLET | Freq: Four times a day (QID) | ORAL | 0 refills | Status: AC | PRN

## 2018-04-02 MED ORDER — ETODOLAC 400 MG PO TABS: 400.0000 mg | ORAL_TABLET | Freq: Two times a day (BID) | ORAL | 0 refills | Status: AC

## 2018-04-02 MED ORDER — HYDROCODONE-ACETAMINOPHEN 5-325 MG PO TABS
1.0000 | ORAL_TABLET | Freq: Once | ORAL | Status: AC
  Administered 2018-04-02: 1 via ORAL
  Filled 2018-04-02: qty 1
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
## 2018-04-02 NOTE — ED Notes (Signed)
Pt requesting pain med prior to discharge, will discuss with PA.

## 2018-04-02 NOTE — Discharge Instructions (Addendum)
Follow-up with your primary care provider for a referral to the orthopedist or call [facility] orthopedic department as listed on your discharge papers.  Begin taking Norco 1 every 6 hours as needed for pain.  Do not drive or operate machinery while taking this medication.  Etodolac 400 mg twice daily with food.  This is the anti-inflammatory.  You may also use heat or ice to your shoulder as needed for discomfort.  Wear sling only when needed and no more than 2 to 3 days.

## 2018-04-02 NOTE — ED Notes (Signed)
Pt taken to Xray, appears in NAD. See triage note.

## 2018-04-02 NOTE — ED Triage Notes (Signed)
Patient complaining of right shoulder pain.  Denies injury.  Pain present for several weeks, has seen MD and was given Tramadol.  States she does not have Tramadol any longer.  Taking Tylenol.

## 2018-04-02 NOTE — ED Notes (Signed)
D/C instructions reviewed with pt, pt verbalized understanding. Unable to esign.

## 2020-01-25 ENCOUNTER — Ambulatory Visit: Payer: Medicare HMO

## 2020-02-05 ENCOUNTER — Ambulatory Visit: Payer: Medicare HMO

## 2020-09-19 ENCOUNTER — Other Ambulatory Visit: Payer: Self-pay

## 2020-09-19 ENCOUNTER — Ambulatory Visit
Admission: EM | Admit: 2020-09-19 | Discharge: 2020-09-19 | Disposition: A | Discharge status: Home / Self Care | Payer: Medicare Other | Attending: Family Medicine | Admitting: Family Medicine

## 2020-09-19 ENCOUNTER — Encounter: Payer: Self-pay | Admitting: Family Medicine

## 2020-09-19 ENCOUNTER — Ambulatory Visit (INDEPENDENT_AMBULATORY_CARE_PROVIDER_SITE_OTHER): Payer: Medicare Other

## 2020-09-19 DIAGNOSIS — R051 Acute cough: Secondary | ICD-10-CM

## 2020-09-19 DIAGNOSIS — J441 Chronic obstructive pulmonary disease with (acute) exacerbation: Secondary | ICD-10-CM | POA: Diagnosis not present

## 2020-09-19 MED ORDER — CETIRIZINE HCL 10 MG PO TABS: 10.0000 mg | ORAL_TABLET | Freq: Every day | ORAL | 0 refills | Status: AC

## 2020-09-19 MED ORDER — PREDNISONE 10 MG PO TABS: 40.0000 mg | ORAL_TABLET | Freq: Every day | ORAL | 0 refills | Status: AC

## 2020-09-19 MED ORDER — FLUTICASONE PROPIONATE 50 MCG/ACT NA SUSP: 2.0000 | Freq: Every day | NASAL | 2 refills | Status: AC

## 2020-09-19 NOTE — Discharge Instructions (Addendum)
Treating you for a COPD exacerbation Take the prednisone as prescribed Albuterol as needed Zyrtec daily for allergies.  Follow up as needed for continued or worsening symptoms
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
## 2021-05-25 ENCOUNTER — Ambulatory Visit (INDEPENDENT_AMBULATORY_CARE_PROVIDER_SITE_OTHER): Payer: Medicare Other | Admitting: Podiatry

## 2021-05-25 ENCOUNTER — Other Ambulatory Visit: Payer: Self-pay

## 2021-05-25 DIAGNOSIS — M792 Neuralgia and neuritis, unspecified: Secondary | ICD-10-CM | POA: Diagnosis not present

## 2021-05-25 DIAGNOSIS — B353 Tinea pedis: Secondary | ICD-10-CM

## 2021-05-25 MED ORDER — CLOTRIMAZOLE-BETAMETHASONE 1-0.05 % EX CREA: 1.0000 "application " | TOPICAL_CREAM | Freq: Two times a day (BID) | CUTANEOUS | 0 refills | Status: AC

## 2021-05-25 NOTE — Progress Notes (Signed)
Subjective:  Patient ID: Stacey Beck, female    DOB: 1960-11-10,  MRN: [id number]  Chief Complaint  Patient presents with   Wound Check    Right foot place on the top of the foot     60 y.o. female presents with the above complaint.  Patient presents with complaint of bilateral neuropathic pain.  Patient is a diabetic with last A1c of 7.2.  She has been improving sugar which has helped the neuropathy.  She is on gabapentin.  She wanted to discuss if there is any treatment options for it.  She also has this fungal patch of the dorsal aspect of the right foot.  It itches sometimes.  She would like to discuss treatment options for that.  She denies any other acute complaints.  She has not seen anyone else prior to seeing me.   Review of Systems: Negative except as noted in the HPI. Denies N/V/F/Ch.  Past Medical History:  Diagnosis Date   Arthritis    Asthma    Collagen vascular disease (HCC)    Diabetes mellitus without complication (HCC)    Hypertension    Kidney stone     Current Outpatient Medications:    clotrimazole-betamethasone (LOTRISONE) cream, Apply 1 application topically 2 (two) times daily., Disp: 30 g, Rfl: 0   albuterol (PROVENTIL HFA;VENTOLIN HFA) 108 (90 BASE) MCG/ACT inhaler, Inhale 2 puffs into the lungs every 4 (four) hours as needed for wheezing or shortness of breath., Disp: , Rfl:    ALPRAZolam (XANAX) 1 MG tablet, Take 1 mg by mouth 3 (three) times daily as needed for anxiety., Disp: , Rfl:    ARIPiprazole (ABILIFY) 2 MG tablet, SMARTSIG:1 Tablet(s) By Mouth Every Evening, Disp: , Rfl:    cetirizine (ZYRTEC) 10 MG tablet, Take 1 tablet (10 mg total) by mouth daily., Disp: 30 tablet, Rfl: 0   etodolac (LODINE) 400 MG tablet, Take 1 tablet (400 mg total) by mouth 2 (two) times daily., Disp: 20 tablet, Rfl: 0   fluticasone (FLONASE) 50 MCG/ACT nasal spray, Place 2 sprays into both nostrils daily., Disp: 16 g, Rfl: 2   gabapentin (NEURONTIN) 400 MG capsule, Take  400 mg by mouth 3 (three) times daily., Disp: , Rfl:    HYDROcodone-acetaminophen (NORCO/VICODIN) 5-325 MG tablet, Take 1 tablet by mouth every 6 (six) hours as needed for moderate pain., Disp: 15 tablet, Rfl: 0   lamoTRIgine (LAMICTAL) 25 MG tablet, Take by mouth., Disp: , Rfl:    linaclotide (LINZESS) 290 MCG CAPS capsule, Take 290 mcg by mouth daily before breakfast., Disp: , Rfl:    lisinopril (PRINIVIL,ZESTRIL) 10 MG tablet, Take 10 mg by mouth daily., Disp: , Rfl:    omeprazole (PRILOSEC) 20 MG capsule, Take 20 mg by mouth daily., Disp: , Rfl:    tiotropium (SPIRIVA) 18 MCG inhalation capsule, Place 18 mcg into inhaler and inhale daily., Disp: , Rfl:    traMADol (ULTRAM) 50 MG tablet, Take 50 mg by mouth every 6 (six) hours as needed., Disp: , Rfl:   Social History   Tobacco Use  Smoking Status Every Day   Packs/day: 1.00   Types: Cigarettes  Smokeless Tobacco Never    No Known Allergies Objective:  There were no vitals filed for this visit. There is no height or weight on file to calculate BMI. Constitutional Well developed. Well nourished.  Vascular Dorsalis pedis pulses palpable bilaterally. Posterior tibial pulses palpable bilaterally. Capillary refill normal to all digits.  No cyanosis or clubbing noted.  Pedal hair growth normal.  Neurologic Normal speech. Oriented to person, place, and time. Decreased sensation to light touch grossly present bilaterally.  Subjective pins and needle noted to bilateral distal tip of the digit protective sensations intact  Dermatologic Nails well groomed and normal in appearance. No open wounds. No skin lesions.  Orthopedic: Small epidermal lysis patch on the dorsum of the foot with subjective component of itching noted.  No ulceration noted.  No clinical signs of infection noted.   Radiographs: None Assessment:   1. Athlete's foot, right   2. Neuropathic pain    Plan:  Patient was evaluated and treated and all questions  answered.  Neuropathic pain with underlying diabetes -All questions and concerns were discussed with the patient in extensive detail.  I discussed some over-the-counter topical application including Vicks VapoRub and lidocaine cane patches.  She states she will give those a try.  Continue take gabapentin as that can help.  She states understanding.  Fungal patch right dorsal foot -I discussed with the patient given that there is itching present there might be a component of fungus involved.  She will benefit from Lotrisone cream to help decrease this.  I have asked her to apply twice a day.  She states understanding will do so -Lotrisone was sent to the pharmacy  No follow-ups on file.

## 2021-11-28 ENCOUNTER — Encounter: Payer: Self-pay | Admitting: Podiatry

## 2021-11-28 ENCOUNTER — Ambulatory Visit (INDEPENDENT_AMBULATORY_CARE_PROVIDER_SITE_OTHER): Payer: Medicare Other | Admitting: Podiatry

## 2021-11-28 DIAGNOSIS — M7751 Other enthesopathy of right foot: Secondary | ICD-10-CM

## 2021-11-28 DIAGNOSIS — M2041 Other hammer toe(s) (acquired), right foot: Secondary | ICD-10-CM | POA: Diagnosis not present

## 2021-11-28 DIAGNOSIS — M7752 Other enthesopathy of left foot: Secondary | ICD-10-CM | POA: Diagnosis not present

## 2021-11-28 DIAGNOSIS — E1165 Type 2 diabetes mellitus with hyperglycemia: Secondary | ICD-10-CM

## 2021-11-28 DIAGNOSIS — M2042 Other hammer toe(s) (acquired), left foot: Secondary | ICD-10-CM | POA: Diagnosis not present

## 2021-11-28 DIAGNOSIS — Z794 Long term (current) use of insulin: Secondary | ICD-10-CM

## 2021-11-30 NOTE — Progress Notes (Signed)
Subjective:  Patient ID: Stacey Beck, female    DOB: [birth year],  MRN: [id number]  Chief Complaint  Patient presents with   Wound Check    61 y.o. female presents with the above complaint.  Patient presents with complaint of bilateral second metatarsophalangeal joint capsulitis.  Patient states pain for touch is progressive gotten worse.  She is a diabetic.  She wanted get it evaluated.  She also would like to discuss treatment options for getting diabetic shoes as she is a diabetic with hammertoe contractures.  She wanted to evaluate that.  She has not seen anyone as prior to seeing me.  Her last A1c is 7.2   Review of Systems: Negative except as noted in the HPI. Denies N/V/F/Ch.  Past Medical History:  Diagnosis Date   Arthritis    Asthma    Collagen vascular disease (HCC)    Diabetes mellitus without complication (HCC)    Hypertension    Kidney stone     Current Outpatient Medications:    albuterol (PROVENTIL HFA;VENTOLIN HFA) 108 (90 BASE) MCG/ACT inhaler, Inhale 2 puffs into the lungs every 4 (four) hours as needed for wheezing or shortness of breath., Disp: , Rfl:    ALPRAZolam (XANAX) 1 MG tablet, Take 1 mg by mouth 3 (three) times daily as needed for anxiety., Disp: , Rfl:    ARIPiprazole (ABILIFY) 2 MG tablet, SMARTSIG:1 Tablet(s) By Mouth Every Evening, Disp: , Rfl:    cetirizine (ZYRTEC) 10 MG tablet, Take 1 tablet (10 mg total) by mouth daily., Disp: 30 tablet, Rfl: 0   clotrimazole-betamethasone (LOTRISONE) cream, Apply 1 application topically 2 (two) times daily., Disp: 30 g, Rfl: 0   etodolac (LODINE) 400 MG tablet, Take 1 tablet (400 mg total) by mouth 2 (two) times daily., Disp: 20 tablet, Rfl: 0   fluticasone (FLONASE) 50 MCG/ACT nasal spray, Place 2 sprays into both nostrils daily., Disp: 16 g, Rfl: 2   gabapentin (NEURONTIN) 400 MG capsule, Take 400 mg by mouth 3 (three) times daily., Disp: , Rfl:    HYDROcodone-acetaminophen (NORCO/VICODIN) 5-325 MG tablet,  Take 1 tablet by mouth every 6 (six) hours as needed for moderate pain., Disp: 15 tablet, Rfl: 0   lamoTRIgine (LAMICTAL) 25 MG tablet, Take by mouth., Disp: , Rfl:    linaclotide (LINZESS) 290 MCG CAPS capsule, Take 290 mcg by mouth daily before breakfast., Disp: , Rfl:    lisinopril (PRINIVIL,ZESTRIL) 10 MG tablet, Take 10 mg by mouth daily., Disp: , Rfl:    omeprazole (PRILOSEC) 20 MG capsule, Take 20 mg by mouth daily., Disp: , Rfl:    tiotropium (SPIRIVA) 18 MCG inhalation capsule, Place 18 mcg into inhaler and inhale daily., Disp: , Rfl:    traMADol (ULTRAM) 50 MG tablet, Take 50 mg by mouth every 6 (six) hours as needed., Disp: , Rfl:   Social History   Tobacco Use  Smoking Status Every Day   Packs/day: 1.00   Types: Cigarettes  Smokeless Tobacco Never    No Known Allergies Objective:  There were no vitals filed for this visit. There is no height or weight on file to calculate BMI. Constitutional Well developed. Well nourished.  Vascular Dorsalis pedis pulses palpable bilaterally. Posterior tibial pulses palpable bilaterally. Capillary refill normal to all digits.  No cyanosis or clubbing noted. Pedal hair growth normal.  Neurologic Normal speech. Oriented to person, place, and time. Epicritic sensation to light touch grossly present bilaterally.  Dermatologic Nails well groomed and normal in appearance. No  open wounds. No skin lesions.  Orthopedic: Pain on palpation bilateral second metatarsophalangeal joint pain with range of motion of the joint.  No deep intra-articular pain noted.  No crepitus noted.  No Mulder's click.  No extensor or flexor tendinitis noted.  Hammertoes 2 through 5 noted mild semiflexible in nature bilaterally.  Mild pain on palpation.   Radiographs: None Assessment:   1. Hammertoe, bilateral   2. Capsulitis of metatarsophalangeal (MTP) joint of right foot   3. Capsulitis of metatarsophalangeal (MTP) joint of left foot    Plan:  Patient was  evaluated and treated and all questions answered.  Bilateral second metatarsophalangeal joint capsulitis -All questions and concerns were discussed with the patient in extensive detail.  Given the amount of pain that she is having I believe she will benefit from steroid injection help decrease acute inflammatory component associate with pain.  Patient agrees with plan like to proceed with steroid injection  Hammertoe 2 through 5 contracture bilaterally -Given the presence of hammertoe contracture in the setting of diabetes with last A1c of 7.2 I believe patient will benefit from diabetic shoes.  She will be scheduled to see Arlys John for diabetic shoes  No follow-ups on file.

## 2021-12-29 ENCOUNTER — Ambulatory Visit (INDEPENDENT_AMBULATORY_CARE_PROVIDER_SITE_OTHER): Payer: Medicare Other | Admitting: Podiatry

## 2021-12-29 DIAGNOSIS — M2041 Other hammer toe(s) (acquired), right foot: Secondary | ICD-10-CM

## 2021-12-29 DIAGNOSIS — M2042 Other hammer toe(s) (acquired), left foot: Secondary | ICD-10-CM

## 2021-12-29 NOTE — Progress Notes (Signed)
Patient presents for a Diabetic shoe measurement.  I will order her a size 9.5 wide Orthofeet Joelle shoes and inserts.  I informed her that we will get authorization for the shoes, then we will order them.  I told her we will call her once we receive the shoes/inserts and schedule her for a follow-up appointment.

## 2022-12-06 ENCOUNTER — Ambulatory Visit: Payer: Medicare Other | Admitting: Nurse Practitioner

## 2023-03-22 ENCOUNTER — Other Ambulatory Visit: Payer: Self-pay

## 2023-03-22 ENCOUNTER — Telehealth: Payer: Self-pay

## 2023-03-22 DIAGNOSIS — Z1211 Encounter for screening for malignant neoplasm of colon: Secondary | ICD-10-CM

## 2023-03-22 MED ORDER — NA SULFATE-K SULFATE-MG SULF 17.5-3.13-1.6 GM/177ML PO SOLN: 1.0000 | Freq: Once | ORAL | 0 refills | Status: AC

## 2023-03-22 NOTE — Telephone Encounter (Signed)
Gastroenterology Pre-Procedure Review  Request Date: 04/23/23 Requesting Physician: Dr. Allegra Lai  PATIENT REVIEW QUESTIONS: The patient responded to the following health history questions as indicated:    1. Are you having any GI issues?  Sometimes has trouble passing stools 2. Do you have a personal history of Polyps? no 3. Do you have a family history of Colon Cancer or Polyps? no 4. Diabetes Mellitus? no 5. Joint replacements in the past 12 months?no 6. Major health problems in the past 3 months?no 7. Any artificial heart valves, MVP, or defibrillator?no    MEDICATIONS & ALLERGIES:    Patient reports the following regarding taking any anticoagulation/antiplatelet therapy:   Plavix, Coumadin, Eliquis, Xarelto, Lovenox, Pradaxa, Brilinta, or Effient? no Aspirin? no  Patient confirms/reports the following medications:  Current Outpatient Medications  Medication Sig Dispense Refill   albuterol (PROVENTIL HFA;VENTOLIN HFA) 108 (90 BASE) MCG/ACT inhaler Inhale 2 puffs into the lungs every 4 (four) hours as needed for wheezing or shortness of breath.     ALPRAZolam (XANAX) 1 MG tablet Take 1 mg by mouth 3 (three) times daily as needed for anxiety.     ARIPiprazole (ABILIFY) 2 MG tablet SMARTSIG:1 Tablet(s) By Mouth Every Evening     cetirizine (ZYRTEC) 10 MG tablet Take 1 tablet (10 mg total) by mouth daily. 30 tablet 0   clotrimazole-betamethasone (LOTRISONE) cream Apply 1 application topically 2 (two) times daily. 30 g 0   etodolac (LODINE) 400 MG tablet Take 1 tablet (400 mg total) by mouth 2 (two) times daily. 20 tablet 0   fluticasone (FLONASE) 50 MCG/ACT nasal spray Place 2 sprays into both nostrils daily. 16 g 2   gabapentin (NEURONTIN) 400 MG capsule Take 400 mg by mouth 3 (three) times daily.     HYDROcodone-acetaminophen (NORCO/VICODIN) 5-325 MG tablet Take 1 tablet by mouth every 6 (six) hours as needed for moderate pain. 15 tablet 0   lamoTRIgine (LAMICTAL) 25 MG tablet Take by  mouth.     linaclotide (LINZESS) 290 MCG CAPS capsule Take 290 mcg by mouth daily before breakfast.     lisinopril (PRINIVIL,ZESTRIL) 10 MG tablet Take 10 mg by mouth daily.     omeprazole (PRILOSEC) 20 MG capsule Take 20 mg by mouth daily.     tiotropium (SPIRIVA) 18 MCG inhalation capsule Place 18 mcg into inhaler and inhale daily.     traMADol (ULTRAM) 50 MG tablet Take 50 mg by mouth every 6 (six) hours as needed.     No current facility-administered medications for this visit.    Patient confirms/reports the following allergies:  No Known Allergies  No orders of the defined types were placed in this encounter.   AUTHORIZATION INFORMATION Primary Insurance: 1D#: Group #:  Secondary Insurance: 1D#: Group #:  SCHEDULE INFORMATION: Date: 04/23/23 Time: Location: [facility]

## 2023-04-22 ENCOUNTER — Encounter: Payer: Self-pay | Admitting: Gastroenterology

## 2023-04-23 ENCOUNTER — Encounter: Payer: Self-pay | Admitting: Gastroenterology

## 2023-04-23 ENCOUNTER — Ambulatory Visit
Admission: RE | Admit: 2023-04-23 | Discharge: 2023-04-23 | Disposition: A | Discharge status: Home / Self Care | Payer: 59 | Attending: Gastroenterology | Admitting: Gastroenterology

## 2023-04-23 ENCOUNTER — Ambulatory Visit: Payer: 59 | Admitting: Anesthesiology

## 2023-04-23 ENCOUNTER — Encounter
Admission: RE | Disposition: A | Discharge status: Home / Self Care | Payer: Self-pay | Source: Home / Self Care | Attending: Gastroenterology

## 2023-04-23 DIAGNOSIS — Z833 Family history of diabetes mellitus: Secondary | ICD-10-CM | POA: Insufficient documentation

## 2023-04-23 DIAGNOSIS — K219 Gastro-esophageal reflux disease without esophagitis: Secondary | ICD-10-CM | POA: Insufficient documentation

## 2023-04-23 DIAGNOSIS — Z794 Long term (current) use of insulin: Secondary | ICD-10-CM | POA: Diagnosis not present

## 2023-04-23 DIAGNOSIS — Z6841 Body Mass Index (BMI) 40.0 and over, adult: Secondary | ICD-10-CM | POA: Insufficient documentation

## 2023-04-23 DIAGNOSIS — Z7985 Long-term (current) use of injectable non-insulin antidiabetic drugs: Secondary | ICD-10-CM | POA: Diagnosis not present

## 2023-04-23 DIAGNOSIS — D122 Benign neoplasm of ascending colon: Secondary | ICD-10-CM | POA: Diagnosis not present

## 2023-04-23 DIAGNOSIS — Z8249 Family history of ischemic heart disease and other diseases of the circulatory system: Secondary | ICD-10-CM | POA: Diagnosis not present

## 2023-04-23 DIAGNOSIS — I10 Essential (primary) hypertension: Secondary | ICD-10-CM | POA: Insufficient documentation

## 2023-04-23 DIAGNOSIS — J4489 Other specified chronic obstructive pulmonary disease: Secondary | ICD-10-CM | POA: Diagnosis not present

## 2023-04-23 DIAGNOSIS — Z1211 Encounter for screening for malignant neoplasm of colon: Secondary | ICD-10-CM | POA: Diagnosis present

## 2023-04-23 DIAGNOSIS — D123 Benign neoplasm of transverse colon: Secondary | ICD-10-CM | POA: Diagnosis not present

## 2023-04-23 DIAGNOSIS — K621 Rectal polyp: Secondary | ICD-10-CM | POA: Insufficient documentation

## 2023-04-23 DIAGNOSIS — D124 Benign neoplasm of descending colon: Secondary | ICD-10-CM | POA: Insufficient documentation

## 2023-04-23 DIAGNOSIS — E119 Type 2 diabetes mellitus without complications: Secondary | ICD-10-CM | POA: Insufficient documentation

## 2023-04-23 DIAGNOSIS — G4730 Sleep apnea, unspecified: Secondary | ICD-10-CM | POA: Diagnosis not present

## 2023-04-23 DIAGNOSIS — F17210 Nicotine dependence, cigarettes, uncomplicated: Secondary | ICD-10-CM | POA: Diagnosis not present

## 2023-04-23 HISTORY — DX: Sleep apnea, unspecified: G47.30

## 2023-04-23 HISTORY — DX: Morbid (severe) obesity due to excess calories: E66.01

## 2023-04-23 HISTORY — DX: Anxiety disorder, unspecified: F41.9

## 2023-04-23 HISTORY — DX: Gastro-esophageal reflux disease without esophagitis: K21.9

## 2023-04-23 HISTORY — PX: POLYPECTOMY: SHX5525

## 2023-04-23 HISTORY — PX: COLONOSCOPY WITH PROPOFOL: SHX5780

## 2023-04-23 LAB — GLUCOSE, CAPILLARY: Glucose-Capillary: 151 mg/dL — ABNORMAL HIGH (ref 70–99)

## 2023-04-23 SURGERY — COLONOSCOPY WITH PROPOFOL
Anesthesia: General

## 2023-04-23 MED ORDER — PROPOFOL 10 MG/ML IV BOLUS
INTRAVENOUS | Status: DC | PRN
  Administered 2023-04-23: 80 mg via INTRAVENOUS

## 2023-04-23 MED ORDER — LIDOCAINE HCL (PF) 2 % IJ SOLN
INTRAMUSCULAR | Status: AC
  Filled 2023-04-23: qty 5

## 2023-04-23 MED ORDER — SODIUM CHLORIDE 0.9 % IV SOLN: INTRAVENOUS | Status: DC

## 2023-04-23 MED ORDER — LIDOCAINE HCL (CARDIAC) PF 100 MG/5ML IV SOSY
PREFILLED_SYRINGE | INTRAVENOUS | Status: DC | PRN
  Administered 2023-04-23: 40 mg via INTRAVENOUS

## 2023-04-23 MED ORDER — PROPOFOL 500 MG/50ML IV EMUL
INTRAVENOUS | Status: DC | PRN
  Administered 2023-04-23: 200 ug/kg/min via INTRAVENOUS

## 2023-04-23 MED ORDER — DEXMEDETOMIDINE HCL IN NACL 80 MCG/20ML IV SOLN
INTRAVENOUS | Status: DC | PRN
  Administered 2023-04-23: 8 ug via INTRAVENOUS

## 2023-04-23 NOTE — Transfer of Care (Signed)
Immediate Anesthesia Transfer of Care Note  Patient: Stacey Beck  Procedure(s) Performed: Procedure(s): COLONOSCOPY WITH PROPOFOL (N/A) POLYPECTOMY  Patient Location: PACU and Endoscopy Unit  Anesthesia Type:General  Level of Consciousness: sedated  Airway & Oxygen Therapy: Patient Spontanous Breathing and Patient connected to nasal cannula oxygen  Post-op Assessment: Report given to RN and Post -op Vital signs reviewed and stable  Post vital signs: Reviewed and stable  Last Vitals:  Vitals:   04/23/23 0952 04/23/23 1045  BP: (!) 145/88 130/79  Pulse: 84   Resp: 20 19  Temp: (!) 36.1 C (!) 36.1 C  SpO2: 95%     Complications: No apparent anesthesia complications

## 2023-04-23 NOTE — Anesthesia Preprocedure Evaluation (Addendum)
Anesthesia Evaluation  Patient identified by MRN, date of birth, ID band Patient awake    Reviewed: Allergy & Precautions, H&P , NPO status , Patient's Chart, lab work & pertinent test results  Airway Mallampati: II  TM Distance: >3 FB Neck ROM: full    Dental no notable dental hx.    Pulmonary shortness of breath, asthma , sleep apnea , COPD, Current SmokerPatient did not abstain from smoking.   Pulmonary exam normal        Cardiovascular hypertension, Normal cardiovascular exam     Neuro/Psych negative neurological ROS  negative psych ROS   GI/Hepatic Neg liver ROS,GERD  Medicated and Controlled,,  Endo/Other  diabetes, Type 2  Morbid obesity  Renal/GU negative Renal ROS  negative genitourinary   Musculoskeletal   Abdominal  (+) + obese  Peds  Hematology negative hematology ROS (+)   Anesthesia Other Findings Past Medical History: No date: Anxiety No date: Arthritis No date: Asthma No date: Collagen vascular disease (HCC) No date: Diabetes mellitus without complication (HCC) No date: GERD (gastroesophageal reflux disease) No date: Hypertension No date: Kidney stone No date: Morbid obesity (HCC) No date: Sleep apnea  Past Surgical History: 10/2003: ABDOMINAL HYSTERECTOMY     Comment:  Partial 2000: ARTHROSCOPIC REPAIR ACL; Right 2015: BREAST SURGERY; Right     Comment:  Biopsy- Keratoacanthoma 2000: CHOLECYSTECTOMY     Comment:  Laparoscopic 09/08/2010: HERNIA REPAIR     Comment:  [facility]- Dr. Cecelia Byars No date: JOINT REPLACEMENT as child: TONSILLECTOMY 1991: TUBAL LIGATION     Reproductive/Obstetrics negative OB ROS                             Anesthesia Physical Anesthesia Plan  ASA: 3  Anesthesia Plan: General   Post-op Pain Management:    Induction: Intravenous  PONV Risk Score and Plan: Propofol infusion and TIVA  Airway Management Planned: Natural  Airway  Additional Equipment:   Intra-op Plan:   Post-operative Plan:   Informed Consent: I have reviewed the patients History and Physical, chart, labs and discussed the procedure including the risks, benefits and alternatives for the proposed anesthesia with the patient or authorized representative who has indicated his/her understanding and acceptance.     Dental Advisory Given  Plan Discussed with: CRNA and Surgeon  Anesthesia Plan Comments: (Pt does not have symptoms of gastric retention and has been on a bowel prep)        Anesthesia Quick Evaluation

## 2023-04-23 NOTE — H&P (Signed)
Arlyss Repress, MD [address]  [address]  [place], [place] [address]  Main: [phone]  Fax: [phone] Pager: [phone]  Primary Care Physician:  [facility] Primary Gastroenterologist:  Dr. Arlyss Repress  Pre-Procedure History & Physical: HPI:  Stacey Beck is a 62 y.o. female is here for an colonoscopy.   Past Medical History:  Diagnosis Date   Anxiety    Arthritis    Asthma    Collagen vascular disease (HCC)    Diabetes mellitus without complication (HCC)    GERD (gastroesophageal reflux disease)    Hypertension    Kidney stone    Morbid obesity (HCC)    Sleep apnea     Past Surgical History:  Procedure Laterality Date   ABDOMINAL HYSTERECTOMY  10/2003   Partial   ARTHROSCOPIC REPAIR ACL Right 2000   BREAST SURGERY Right 2015   Biopsy- Keratoacanthoma   CHOLECYSTECTOMY  2000   Laparoscopic   HERNIA REPAIR  09/08/2010   [facility]- Dr. Cecelia Byars   JOINT REPLACEMENT     TONSILLECTOMY  as child   TUBAL LIGATION  1991    Prior to Admission medications   Medication Sig Start Date End Date Taking? Authorizing Provider  Dulaglutide 1.5 MG/0.5ML SOAJ Inject into the skin every 7 (seven) days.   Yes [provider]  insulin glargine (LANTUS) 100 UNIT/ML injection Inject 10 Units into the skin at bedtime.   Yes [provider]  albuterol (PROVENTIL HFA;VENTOLIN HFA) 108 (90 BASE) MCG/ACT inhaler Inhale 2 puffs into the lungs every 4 (four) hours as needed for wheezing or shortness of breath.    [provider]  ALPRAZolam Prudy Feeler) 1 MG tablet Take 1 mg by mouth 3 (three) times daily as needed for anxiety.    [provider]  ARIPiprazole (ABILIFY) 2 MG tablet SMARTSIG:1 Tablet(s) By Mouth Every Evening 09/07/20   [provider]  cetirizine (ZYRTEC) 10 MG tablet Take 1 tablet (10 mg total) by mouth daily. 09/19/20   Dahlia Byes A, NP  clotrimazole-betamethasone (LOTRISONE) cream Apply 1 application topically 2  (two) times daily. 05/25/21   Candelaria Stagers, DPM  etodolac (LODINE) 400 MG tablet Take 1 tablet (400 mg total) by mouth 2 (two) times daily. 04/02/18   Tommi Rumps, PA-C  fluticasone (FLONASE) 50 MCG/ACT nasal spray Place 2 sprays into both nostrils daily. 09/19/20   Dahlia Byes A, NP  gabapentin (NEURONTIN) 400 MG capsule Take 400 mg by mouth 3 (three) times daily.    [provider]  HYDROcodone-acetaminophen (NORCO/VICODIN) 5-325 MG tablet Take 1 tablet by mouth every 6 (six) hours as needed for moderate pain. 04/02/18   Tommi Rumps, PA-C  lamoTRIgine (LAMICTAL) 25 MG tablet Take by mouth. 09/08/20   [provider]  linaclotide (LINZESS) 290 MCG CAPS capsule Take 290 mcg by mouth daily before breakfast.    [provider]  lisinopril (PRINIVIL,ZESTRIL) 10 MG tablet Take 10 mg by mouth daily.    [provider]  omeprazole (PRILOSEC) 20 MG capsule Take 20 mg by mouth daily.    [provider]  tiotropium (SPIRIVA) 18 MCG inhalation capsule Place 18 mcg into inhaler and inhale daily.    [provider]  traMADol (ULTRAM) 50 MG tablet Take 50 mg by mouth every 6 (six) hours as needed.    [provider]  Fluticasone-Salmeterol (ADVAIR) 250-50 MCG/DOSE AEPB Inhale 1 puff into the lungs 2 (two) times daily.  09/19/20  [provider]    Allergies as of 03/22/2023   (No Known Allergies)    Family History  Problem Relation Age of Onset   Peripheral vascular disease Mother    Heart disease Father    Diabetes Father    Hypertension Father    Diabetes Son    Hypertension Son    Seizures Sister    Diabetes Brother     Social History   Socioeconomic History   Marital status: Widowed    Spouse name: Not on file   Number of children: Not on file   Years of education: Not on file   Highest education level: Not on file  Occupational History   Not on file  Tobacco Use   Smoking status: Every Day    Current  packs/day: 0.50    Types: Cigarettes   Smokeless tobacco: Never  Vaping Use   Vaping status: Never Used  Substance and Sexual Activity   Alcohol use: No   Drug use: Not Currently   Sexual activity: Not on file  Other Topics Concern   Not on file  Social History Narrative   Not on file   Social Determinants of Health   Financial Resource Strain: Low Risk  (10/19/2022)   Received [facility]   Overall Financial Resource Strain (CARDIA)    Difficulty of Paying Living Expenses: Not hard at all  Food Insecurity: No Food Insecurity (10/19/2022)   Received from [facility]   Hunger Vital Sign    Worried About Running Out of Food in the Last Year: Never true    Ran Out of Food in the Last Year: Never true  Transportation Needs: No Transportation Needs (10/19/2022)   Received from [facility]   PRAPARE - Transportation    Lack of Transportation (Medical): No    Lack of Transportation (Non-Medical): No  Physical Activity: Not on file  Stress: Not on file  Social Connections: Not on file  Intimate Partner Violence: Not on file    Review of Systems: See HPI, otherwise negative ROS  Physical Exam: BP 130/79 (BP Location: Left Arm)   Pulse 84   Temp (!) 97 F (36.1 C) (Temporal)   Resp 19   Ht 5\' 7"  (1.702 m)   Wt 260 lb (117.9 kg)   SpO2 95%   BMI 40.72 kg/m  General:   Alert,  pleasant and cooperative in NAD Head:  Normocephalic and atraumatic. Neck:  Supple; no masses or thyromegaly. Lungs:  Clear throughout to auscultation.    Heart:  Regular rate and rhythm. Abdomen:  Soft, nontender and nondistended. Normal bowel sounds, without guarding, and without rebound.   Neurologic:  Alert and  oriented x4;  grossly normal neurologically.  Impression/Plan: Stacey Beck is here for an colonoscopy to be performed for colon cancer screening  Risks, benefits, limitations, and alternatives regarding  colonoscopy have been reviewed with the patient.  Questions have  been answered.  All parties agreeable.   Lannette Donath, MD  04/23/2023, 10:52 AM

## 2023-04-23 NOTE — Op Note (Signed)
[facility] Patient Name: Stacey Beck Procedure Date: 04/23/2023 9:56 AM MRN: [id number] Account #: [id number] Date of Birth: July 27, 1960 Admit Type: Outpatient Age: 62 Room: [facility] ENDO ROOM 3 Gender: Female Note Status: Finalized Instrument Name: Colonoscope [id number] Procedure:             Colonoscopy Indications:           Screening for colorectal malignant neoplasm, Last                         colonoscopy: January 2015 Providers:             Toney Reil MD, MD Referring MD:          [facility] (Referring MD) Medicines:             General Anesthesia Complications:         No immediate complications. Estimated blood loss: None. Procedure:             Pre-Anesthesia Assessment:                        - Prior to the procedure, a History and Physical was                         performed, and patient medications and allergies were                         reviewed. The patient is competent. The risks and                         benefits of the procedure and the sedation options and                         risks were discussed with the patient. All questions                         were answered and informed consent was obtained.                         Patient identification and proposed procedure were                         verified by the physician, the nurse, the                         anesthesiologist, the anesthetist and the technician                         in the pre-procedure area in the procedure room in the                         endoscopy suite. Mental Status Examination: alert and                         oriented. Airway Examination: normal oropharyngeal                         airway and neck mobility. Respiratory Examination:  clear to auscultation. CV Examination: normal.                         Prophylactic Antibiotics: The patient does not require                         prophylactic  antibiotics. Prior Anticoagulants: The                         patient has taken no anticoagulant or antiplatelet                         agents. ASA Grade Assessment: III - A patient with                         severe systemic disease. After reviewing the risks and                         benefits, the patient was deemed in satisfactory                         condition to undergo the procedure. The anesthesia                         plan was to use general anesthesia. Immediately prior                         to administration of medications, the patient was                         re-assessed for adequacy to receive sedatives. The                         heart rate, respiratory rate, oxygen saturations,                         blood pressure, adequacy of pulmonary ventilation, and                         response to care were monitored throughout the                         procedure. The physical status of the patient was                         re-assessed after the procedure.                        After obtaining informed consent, the colonoscope was                         passed under direct vision. Throughout the procedure,                         the patient's blood pressure, pulse, and oxygen                         saturations were monitored continuously. The  Colonoscope was introduced through the anus and                         advanced to the the cecum, identified by appendiceal                         orifice and ileocecal valve. The colonoscopy was                         performed without difficulty. The patient tolerated                         the procedure well. The quality of the bowel                         preparation was evaluated using the BBPS [facility] Bowel                         Preparation Scale) with scores of: Right Colon = 3,                         Transverse Colon = 3 and Left Colon = 3 (entire mucosa                         seen  well with no residual staining, small fragments                         of stool or opaque liquid). The total BBPS score                         equals 9. The ileocecal valve, appendiceal orifice,                         and rectum were photographed. Findings:      The perianal and digital rectal examinations were normal. Pertinent       negatives include normal sphincter tone and no palpable rectal lesions.      Two sessile polyps were found in the ascending colon. The polyps were 4       to 5 mm in size. These polyps were removed with a cold snare. Resection       and retrieval were complete. Estimated blood loss: none.      Six sessile polyps were found in the transverse colon. The polyps were 5       to 6 mm in size. These polyps were removed with a cold snare. Resection       and retrieval were complete. Estimated blood loss: none.      Four sessile polyps were found in the descending colon. The polyps were       6 to 7 mm in size. These polyps were removed with a cold snare.       Resection and retrieval were complete.      A 4 mm polyp was found in the rectum. The polyp was sessile. The polyp       was removed with a cold snare. Resection and retrieval were complete.       Estimated blood loss: none.      The retroflexed view of the distal rectum and  anal verge was normal and       showed no anal or rectal abnormalities. Impression:            - Two 4 to 5 mm polyps in the ascending colon, removed                         with a cold snare. Resected and retrieved.                        - Six 5 to 6 mm polyps in the transverse colon,                         removed with a cold snare. Resected and retrieved.                        - Four 6 to 7 mm polyps in the descending colon,                         removed with a cold snare. Resected and retrieved.                        - One 4 mm polyp in the rectum, removed with a cold                         snare. Resected and retrieved.                         - The distal rectum and anal verge are normal on                         retroflexion view. Recommendation:        - Discharge patient to home (with escort).                        - Resume previous diet today.                        - Continue present medications.                        - Await pathology results.                        - Repeat colonoscopy in 1 year for surveillance of                         multiple polyps. Procedure Code(s):     --- Professional ---                        [phone], Colonoscopy, flexible; with removal of                         tumor(s), polyp(s), or other lesion(s) by snare                         technique Diagnosis Code(s):     --- Professional ---  Z12.11, Encounter for screening for malignant neoplasm                         of colon                        D12.2, Benign neoplasm of ascending colon                        D12.3, Benign neoplasm of transverse colon (hepatic                         flexure or splenic flexure)                        D12.8, Benign neoplasm of rectum                        D12.4, Benign neoplasm of descending colon CPT copyright 2022 American Medical Association. All rights reserved. The codes documented in this report are preliminary and upon coder review may  be revised to meet current compliance requirements. Dr. Libby Maw Toney Reil MD, MD 04/23/2023 10:45:22 AM This report has been signed electronically. Number of Addenda: 0 Note Initiated On: 04/23/2023 9:56 AM Scope Withdrawal Time: 0 hours 20 minutes 42 seconds  Total Procedure Duration: 0 hours 23 minutes 38 seconds  Estimated Blood Loss:  Estimated blood loss: none.      [facility]

## 2023-04-23 NOTE — Anesthesia Postprocedure Evaluation (Signed)
Anesthesia Post Note  Patient: Stacey Beck  Procedure(s) Performed: COLONOSCOPY WITH PROPOFOL POLYPECTOMY  Patient location during evaluation: Endoscopy Anesthesia Type: General Level of consciousness: awake and alert Pain management: pain level controlled Vital Signs Assessment: post-procedure vital signs reviewed and stable Respiratory status: spontaneous breathing, nonlabored ventilation and respiratory function stable Cardiovascular status: blood pressure returned to baseline and stable Postop Assessment: no apparent nausea or vomiting Anesthetic complications: no   No notable events documented.   Last Vitals:  Vitals:   04/23/23 1109 04/23/23 1115  BP:  (!) 137/100  Pulse:    Resp:    Temp:    SpO2: 96%     Last Pain:  Vitals:   04/23/23 1105  TempSrc:   PainSc: 0-No pain                 Foye Deer

## 2023-04-23 NOTE — Anesthesia Procedure Notes (Signed)
Date/Time: 04/23/2023 10:16 AM  Performed by: Stormy Fabian, CRNAPre-anesthesia Checklist: Patient identified, Emergency Drugs available, Suction available and Patient being monitored Patient Re-evaluated:Patient Re-evaluated prior to induction Oxygen Delivery Method: Supernova nasal CPAP Induction Type: IV induction Dental Injury: Teeth and Oropharynx as per pre-operative assessment  Comments: Nasal cannula with etCO2 monitoring

## 2023-04-24 ENCOUNTER — Encounter: Payer: Self-pay | Admitting: Gastroenterology

## 2023-04-24 LAB — SURGICAL PATHOLOGY

## 2023-04-26 ENCOUNTER — Encounter: Payer: Self-pay | Admitting: Gastroenterology

## 2023-04-26 ENCOUNTER — Telehealth: Payer: Self-pay

## 2023-04-26 DIAGNOSIS — Z860100 Personal history of colon polyps, unspecified: Secondary | ICD-10-CM

## 2023-04-26 NOTE — Telephone Encounter (Signed)
-----   Message from [facility] sent at 04/26/2023  9:20 AM EST ----- Please inform patient that the pathology results from recent colonoscopy shows that the multiple polyps that were removed were precancerous.  Given the number of precancerous polyps were greater than 10, recommend seeing a genetic counselor to evaluate for any hereditary colon cancer syndromes.  Please refer her to genetics if patient is agreeable  Rohini Vanga

## 2023-04-26 NOTE — Telephone Encounter (Signed)
Called and left a message for call back  

## 2023-04-29 NOTE — Telephone Encounter (Signed)
Called and left a message for call back  

## 2023-04-30 NOTE — Telephone Encounter (Signed)
Placed referral and sent letter

## 2023-04-30 NOTE — Telephone Encounter (Signed)
Called and left a message for call back. Sent [employer]. Please let me know if there is anything else you would like me to do to try to reach the patient

## 2023-04-30 NOTE — Addendum Note (Signed)
Addended by: Radene Knee L on: 04/30/2023 12:44 PM   Modules accepted: Orders

## 2023-05-01 NOTE — Telephone Encounter (Signed)
Patient left a voicemail on my phone to go over the results. Return patient called and patient verbalized understanding of results

## 2023-05-01 NOTE — Telephone Encounter (Signed)
 Pt returning call requesting call back.

## 2023-05-13 NOTE — Progress Notes (Unsigned)
REFERRING PROVIDER: Toney Reil, MD [address] [place],  [place] [address]  PRIMARY PROVIDER:  [facility]  PRIMARY REASON FOR VISIT:  1. Personal history of colon polyps, unspecified      HISTORY OF PRESENT ILLNESS:   Stacey Beck, a 62 y.o. female, was seen for a  cancer genetics consultation at the request of Dr. Allegra Lai due to a personal history of colon polyps.  Stacey Beck presents to clinic today to discuss the possibility of a hereditary predisposition to cancer, genetic testing, and to further clarify her future cancer risks, as well as potential cancer risks for family members.   CANCER HISTORY:  Stacey Beck is a 62 y.o. female with no personal history of cancer.    RISK FACTORS:  Menarche was at age 23-13.  First live birth at age 9.  Ovaries intact: yes.  Hysterectomy: yes.  Colonoscopy: yes;  had colonoscopy in 2024 that showed 12 polyps - tubular adenomas, and a rectal polyp that was hyperplastic, she had a colonoscopy in 2015 that showed 3 polyps . Mammogram within the last year: yes. Number of breast biopsies: 1. Up to date with pelvic exams: no.  Past Medical History:  Diagnosis Date   Anxiety    Arthritis    Asthma    Collagen vascular disease (HCC)    Diabetes mellitus without complication (HCC)    GERD (gastroesophageal reflux disease)    Hypertension    Kidney stone    Morbid obesity (HCC)    Sleep apnea     Past Surgical History:  Procedure Laterality Date   ABDOMINAL HYSTERECTOMY  10/2003   Partial   ARTHROSCOPIC REPAIR ACL Right 2000   BREAST SURGERY Right 2015   Biopsy- Keratoacanthoma   CHOLECYSTECTOMY  2000   Laparoscopic   COLONOSCOPY WITH PROPOFOL N/A 04/23/2023   Procedure: COLONOSCOPY WITH PROPOFOL;  Surgeon: Toney Reil, MD;  Location: [facility] ENDOSCOPY;  Service: Gastroenterology;  Laterality: N/A;   HERNIA REPAIR  09/08/2010   [facility]- Dr. Cecelia Byars   JOINT REPLACEMENT     POLYPECTOMY  04/23/2023    Procedure: POLYPECTOMY;  Surgeon: Toney Reil, MD;  Location: [facility] ENDOSCOPY;  Service: Gastroenterology;;   TONSILLECTOMY  as child   TUBAL LIGATION  1991    FAMILY HISTORY:  We obtained a detailed, 4-generation family history.  Significant diagnoses are listed below: Family History  Problem Relation Age of Onset   Peripheral vascular disease Mother    Heart disease Father    Diabetes Father    Hypertension Father    Seizures Sister    Colon polyps Sister    Leukemia Sister    Lymphoma Sister    Lung cancer Paternal Aunt    Diabetes Son    Hypertension Son    Lung cancer Cousin    Cancer Cousin        unk type   Throat cancer Cousin     Stacey Beck has 1 son, 75, and 2 twin daughters, 40. She had 2 sisters. One passed from leukemia and lymphoma at 50. Her other sister, Lafonda Mosses, is 20 and has had >10 colon polyps.   Stacey Beck mother is living at 80 and does not have information about her biological family.   Stacey Beck father passed at 29. A paternal aunt had lung cancer. A paternal cousin also had lung cancer. Another cousin had unknown type of cancer. Another cousin is living and has throat cancer.   Stacey Beck is  unaware of previous family history of genetic testing for hereditary cancer risks. There is no reported Ashkenazi Jewish ancestry. There is no known consanguinity.    GENETIC COUNSELING ASSESSMENT: Stacey Beck is a 62 y.o. female with a personal history of colon polyps which is somewhat suggestive of a hereditary cancer syndrome and predisposition to cancer. We, therefore, discussed and recommended the following at today's visit.   DISCUSSION: We discussed that polyps in general are common, however, most people have fewer than 5 lifetime polyps.  When an individual has 10 or more polyps we become concerned about an underlying polyposis syndrome.  The most common hereditary polyposis syndromes are caused by problems in the APC and MUTYH genes. We discussed that  testing is beneficial for several reasons including knowing how to follow individuals for cancer screenings, and understand if other family members could be at risk for cancer and allow them to undergo genetic testing.   We reviewed the characteristics, features and inheritance patterns of hereditary cancer syndromes. We also discussed genetic testing, including the appropriate family members to test, the process of testing, insurance coverage and turn-around-time for results. We discussed the implications of a negative, positive and/or variant of uncertain significant result. We recommended Stacey Beck pursue genetic testing for the [facility] Multi-Cancer+Heme Malignancy gene panel.   Based on Stacey Beck's personal history of colon polyps, she meets medical criteria for genetic testing. Despite that she meets criteria, she may still have an out of pocket cost.   PLAN: After considering the risks, benefits, and limitations, Stacey Beck provided informed consent to pursue genetic testing and the blood sample was sent to [facility] for analysis of the Multi-Cancer+Heme Malignancy panel. Results should be available within approximately 2-3 weeks' time, at which point they will be disclosed by telephone to Stacey Beck, as will any additional recommendations warranted by these results. Stacey Beck will receive a summary of her genetic counseling visit and a copy of her results once available. This information will also be available in Epic.   Stacey Beck questions were answered to her satisfaction today. Our contact information was provided should additional questions or concerns arise. Thank you for the referral and allowing [place] to share in the care of your patient.   Lacy Duverney, MS, [facility] Genetic Counselor [email or web address] Phone: [phone]  The patient was seen for a total of 20 minutes in face-to-face genetic counseling. Patient's mother was also present. Dr. Blake Divine was  available for discussion regarding this case.   _______________________________________________________________________ For Office Staff:  Number of people involved in session: 2 Was an Intern/ student involved with case: no

## 2023-05-14 ENCOUNTER — Inpatient Hospital Stay: Payer: 59 | Attending: Oncology | Admitting: Licensed Clinical Social Worker

## 2023-05-14 ENCOUNTER — Other Ambulatory Visit: Payer: 59

## 2023-05-14 ENCOUNTER — Encounter: Payer: Self-pay | Admitting: Licensed Clinical Social Worker

## 2023-05-14 DIAGNOSIS — Z807 Family history of other malignant neoplasms of lymphoid, hematopoietic and related tissues: Secondary | ICD-10-CM | POA: Diagnosis not present

## 2023-05-14 DIAGNOSIS — Z806 Family history of leukemia: Secondary | ICD-10-CM

## 2023-05-14 DIAGNOSIS — Z860100 Personal history of colon polyps, unspecified: Secondary | ICD-10-CM | POA: Diagnosis not present

## 2023-05-14 DIAGNOSIS — Z83719 Family history of colon polyps, unspecified: Secondary | ICD-10-CM | POA: Diagnosis not present

## 2023-05-27 ENCOUNTER — Telehealth: Payer: Self-pay | Admitting: Licensed Clinical Social Worker

## 2023-05-27 NOTE — Telephone Encounter (Signed)
I contacted Stacey Beck to discuss her genetic testing results. 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 Detailed clinic note to follow.   The test report has been scanned into EPIC and is located under the Molecular Pathology section of the Results Review tab.  A portion of the result report is included below for reference.      Stacey Duverney, MS, [facility] Genetic Counselor [email or web address] Phone: [phone]

## 2023-05-29 ENCOUNTER — Encounter: Payer: Self-pay | Admitting: Licensed Clinical Social Worker

## 2023-05-29 ENCOUNTER — Ambulatory Visit: Payer: Self-pay | Admitting: Licensed Clinical Social Worker

## 2023-05-29 DIAGNOSIS — Z1379 Encounter for other screening for genetic and chromosomal anomalies: Secondary | ICD-10-CM

## 2023-05-29 NOTE — Progress Notes (Signed)
HPI:   Ms. Trotman was previously seen in the [facility] clinic due to a personal history of colon polyps and concerns regarding a hereditary predisposition to cancer. Please refer to our prior cancer genetics clinic note for more information regarding our discussion, assessment and recommendations, at the time. Ms. Favreau recent genetic test results were disclosed to her, as were recommendations warranted by these results. These results and recommendations are discussed in more detail below.  CANCER HISTORY:  Oncology History   No history exists.    FAMILY HISTORY:  We obtained a detailed, 4-generation family history.  Significant diagnoses are listed below: Family History  Problem Relation Age of Onset   Peripheral vascular disease Mother    Heart disease Father    Diabetes Father    Hypertension Father    Seizures Sister    Colon polyps Sister    Leukemia Sister    Lymphoma Sister    Lung cancer Paternal Aunt    Diabetes Son    Hypertension Son    Lung cancer Cousin    Cancer Cousin        unk type   Throat cancer Cousin     Ms. Kiester has 1 son, 32, and 2 twin daughters, 38. She had 2 sisters. One passed from leukemia and lymphoma at 50. Her other sister, Lafonda Mosses, is 69 and has had >10 colon polyps.    Ms. Bitz mother is living at 41 and does not have information about her biological family.    Ms. Surratt father passed at 54. A paternal aunt had lung cancer. A paternal cousin also had lung cancer. Another cousin had unknown type of cancer. Another cousin is living and has throat cancer.    Ms. Santillana is unaware of previous family history of genetic testing for hereditary cancer risks. There is no reported Ashkenazi Jewish ancestry. There is no known consanguinity.     GENETIC TEST RESULTS:  The [facility] Multi-Cancer+Hereditary Hematological Malignancy Panel found no pathogenic mutations.  The Multi-Cancer +Hereditary Hematological Malignancy Panel l  offered by [facility] includes sequencing and/or deletion/duplication analysis of the following 125 genes:   ADA, AIP, ALK, ANKRD26*, APC*, ATM*, AXIN2, BAP1, BARD1, BLM, BMPR1A, BRCA1, BRCA2, BRIP1, CARD11, CARMIL2, CASP8, CBL, CD27, CDC73, CDH1, CDK4, CDKN1B, CDKN2A (p14ARF), CDKN2A (p16INK4a), CEBPA, CHEK2, CTLA4, CTNNA1, CTPS1, DDX41, DICER1*, DOCK8, EGFR, ELANE, EPCAM*, ERCC6L2, ETV6, FADD, FAS, FASLG, FCHO1, FH*, FLCN, G6PC3, GATA2, GFI1*, GREM1*, HAX1, HOXB13, IKZF1, IL2RA, IL2RB, ITK, KIT, KRAS, LZTR1, MAGT1, MAX*, MBD4, MCM4, MECOM, MEN1*, MET*, MITF, MLH1*, MSH2*, MSH3*, MSH6*, MUTYH, NBN, NF1*, NF2, NTHL1, PALB2, PDGFRA, PIK3CD, PIK3R1, PMS2*, POLD1*, POLE, POT1, PRKAR1A, PRKCD, PTCH1, PTEN*, PTPN11, RAC2, RAD51C, RAD51D, RASGRP1, RB1*, RET, RHOH, RMRP, RTEL1, RUNX1, SAMD9, SAMD9L, SDHA*, SDHAF2, SDHB, SDHC*, SDHD, SH2D1A, SMAD4, SMARCA4, SMARCB1, SMARCE1, SRP72, STAT3, STK11, STK4, STXBP2, SUFU, TERC, TERT, TMEM127, TNFRSF13B, TP53, TPP2, TSC1*, TSC2, VHL, WAS, XIAP  The test report has been scanned into EPIC and is located under the Molecular Pathology section of the Results Review tab.  A portion of the result report is included below for reference. Genetic testing reported out on 05/23/2023.      Even though a pathogenic variant explaining her history was not identified, possible explanations for the cancer in the family may include: There may be no hereditary risk for cancer in the family. The cancers in Ms. Calixto and/or her family may be sporadic/familial or due to other genetic and environmental factors. There may be a gene mutation in  one of these genes that current testing methods cannot detect but that chance is small. There could be another gene that has not yet been discovered, or that we have not yet tested, that is responsible for the cancer diagnoses in the family.  It is also possible there is a hereditary cause for the cancer in the family that Ms. Hochmuth did not  inherit.  Therefore, it is important to remain in touch with cancer genetics in the future so that we can continue to offer Ms. Nees the most up to date genetic testing.   [id number] Carrier: A single likely pathogenic variant in TNFRSF13B was identified. This gene is associated with autosomal recessive common variable immunodeficiency (CVID), meaning two pathogenic variants would be needed to cause the condition. 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  This result is insufficient to cause autosomal recessive TNFRSF13B-related conditions; however, carrier status does impact reproductive risk. Biological relatives have a chance of being a carrier for or being at risk for autosomal recessive TNFRSF13B-related conditions. Testing should be considered if clinically appropriate. The chance of having a child with autosomal recessive TNFRSF13B-related conditions depends on the carrier state of this individual's partner.  ADDITIONAL GENETIC TESTING:  We discussed with Ms. Buckholz that her genetic testing was fairly extensive.  If there are additional relevant genes identified to increase cancer risk that can be analyzed in the future, we would be happy to discuss and coordinate this testing at that time.    CANCER SCREENING RECOMMENDATIONS:  We have not identified a hereditary cause for patient's personal history of colon polyps/family history of cancer at this time.   An individual's cancer risk and medical management are not determined by genetic test results alone. Overall cancer risk assessment incorporates additional factors, including personal medical history, family history, and any available genetic information that may result in a personalized plan for cancer prevention and surveillance. Therefore, it is recommended she continue to follow the cancer management and screening guidelines provided by her primary healthcare provider.  Colon Cancer Screening: This  negative genetic test simply tells [place] that we cannot yet define why Ms. Mirenda has had an increased number of colorectal polyps, and screening should be based on the prospect that she will likely form more colon polyps and should, therefore, undergo more frequent colonoscopy screening at intervals determined by her GI providers.    RECOMMENDATIONS FOR FAMILY MEMBERS:   As mentioned above, family members can consider testing to see if they are also a carrier of TNFRSF13B-related conditions, or if they have the condition themselves (would mean inheriting a pathogenic variant in this gene from both parents). Individuals in this family might be at some increased risk of developing cancer, over the general population risk, due to the family history of cancer.  Individuals in the family should notify their providers of the family history of cancer. We recommend women in this family have a yearly mammogram beginning at age 11, or 25 years younger than the earliest onset of cancer, an annual clinical breast exam, and perform monthly breast self-exams.  Family members should have colonoscopies by at age 35, or earlier, as recommended by their providers.  FOLLOW-UP:  Lastly, we discussed with Ms. Doudna that cancer genetics is a rapidly advancing field and it is possible that new genetic tests will be appropriate for her and/or her family members in the future. We encouraged her to remain in contact with cancer genetics on an annual basis so we can  update her personal and family histories and let her know of advances in cancer genetics that may benefit this family.   Our contact number was provided. Ms. Rold questions were answered to her satisfaction, and she knows she is welcome to call us at anytime with additional questions or concerns.    Lacy Duverney, MS, [facility] Genetic Counselor [email or web address] Phone: [phone]

## 2024-04-29 ENCOUNTER — Telehealth: Payer: Self-pay

## 2024-04-29 NOTE — Telephone Encounter (Signed)
 Per pt she returning call to schedule procedure

## 2024-04-30 ENCOUNTER — Telehealth: Payer: Self-pay

## 2024-04-30 ENCOUNTER — Other Ambulatory Visit: Payer: Self-pay

## 2024-04-30 DIAGNOSIS — Z860100 Personal history of colon polyps, unspecified: Secondary | ICD-10-CM

## 2024-04-30 MED ORDER — NA SULFATE-K SULFATE-MG SULF 17.5-3.13-1.6 GM/177ML PO SOLN: 354.0000 mL | Freq: Once | ORAL | 0 refills | Status: AC

## 2024-04-30 NOTE — Telephone Encounter (Signed)
 Called patient back to schedule her colonoscopy (06/30/2024 with Dr. Jinny [facility]).

## 2024-04-30 NOTE — Telephone Encounter (Signed)
 Gastroenterology Pre-Procedure Review  Request Date: 06/30/2024 Requesting Physician: Dr. Jinny  PATIENT REVIEW QUESTIONS: The patient responded to the following health history questions as indicated:    1. Are you having any GI issues? no 2. Do you have a personal history of Polyps? yes (04/23/2023  Dr. Unk) 3. Do you have a family history of Colon Cancer or Polyps? no 4. Diabetes Mellitus? yes (2019) 5. Joint replacements in the past 12 months?no 6. Major health problems in the past 3 months?no 7. Any artificial heart valves, MVP, or defibrillator?no    MEDICATIONS & ALLERGIES:    Patient reports the following regarding taking any anticoagulation/antiplatelet therapy:   Plavix, Coumadin, Eliquis, Xarelto, Lovenox, Pradaxa, Brilinta, or Effient? no Aspirin? no  Patient confirms/reports the following medications:  Current Outpatient Medications  Medication Sig Dispense Refill   albuterol (PROVENTIL HFA;VENTOLIN HFA) 108 (90 BASE) MCG/ACT inhaler Inhale 2 puffs into the lungs every 4 (four) hours as needed for wheezing or shortness of breath.     ALPRAZolam (XANAX) 1 MG tablet Take 1 mg by mouth 3 (three) times daily as needed for anxiety.     ARIPiprazole (ABILIFY) 2 MG tablet SMARTSIG:1 Tablet(s) By Mouth Every Evening     cetirizine  (ZYRTEC ) 10 MG tablet Take 1 tablet (10 mg total) by mouth daily. 30 tablet 0   clotrimazole -betamethasone  (LOTRISONE ) cream Apply 1 application topically 2 (two) times daily. 30 g 0   Dulaglutide 1.5 MG/0.5ML SOAJ Inject into the skin every 7 (seven) days.     etodolac  (LODINE ) 400 MG tablet Take 1 tablet (400 mg total) by mouth 2 (two) times daily. 20 tablet 0   fluticasone  (FLONASE ) 50 MCG/ACT nasal spray Place 2 sprays into both nostrils daily. 16 g 2   gabapentin (NEURONTIN) 400 MG capsule Take 400 mg by mouth 3 (three) times daily.     HYDROcodone -acetaminophen  (NORCO/VICODIN) 5-325 MG tablet Take 1 tablet by mouth every 6 (six) hours as needed  for moderate pain. 15 tablet 0   insulin glargine (LANTUS) 100 UNIT/ML injection Inject 10 Units into the skin at bedtime.     lamoTRIgine (LAMICTAL) 25 MG tablet Take by mouth.     linaclotide (LINZESS) 290 MCG CAPS capsule Take 290 mcg by mouth daily before breakfast.     lisinopril (PRINIVIL,ZESTRIL) 10 MG tablet Take 10 mg by mouth daily. (Patient not taking: Reported on 04/23/2023)     omeprazole (PRILOSEC) 20 MG capsule Take 20 mg by mouth daily.     tiotropium (SPIRIVA) 18 MCG inhalation capsule Place 18 mcg into inhaler and inhale daily.     traMADol (ULTRAM) 50 MG tablet Take 50 mg by mouth every 6 (six) hours as needed.     No current facility-administered medications for this visit.    Patient confirms/reports the following allergies:  No Known Allergies  No orders of the defined types were placed in this encounter.   AUTHORIZATION INFORMATION Primary Insurance: 1D#: Group #:  Secondary Insurance: 1D#: Group #:  SCHEDULE INFORMATION: Date: 06/30/2024 Time: Location:  [facility]  Dr. Jinny

## 2024-06-30 ENCOUNTER — Ambulatory Visit: Admitting: Anesthesiology

## 2024-06-30 ENCOUNTER — Encounter
Admission: RE | Disposition: A | Discharge status: Home / Self Care | Payer: Self-pay | Source: Home / Self Care | Attending: Gastroenterology

## 2024-06-30 ENCOUNTER — Encounter: Payer: Self-pay | Admitting: Gastroenterology

## 2024-06-30 ENCOUNTER — Ambulatory Visit
Admission: RE | Admit: 2024-06-30 | Discharge: 2024-06-30 | Disposition: A | Discharge status: Home / Self Care | Attending: Gastroenterology | Admitting: Gastroenterology

## 2024-06-30 DIAGNOSIS — Z860100 Personal history of colon polyps, unspecified: Secondary | ICD-10-CM

## 2024-06-30 DIAGNOSIS — D124 Benign neoplasm of descending colon: Secondary | ICD-10-CM | POA: Insufficient documentation

## 2024-06-30 DIAGNOSIS — Z860101 Personal history of adenomatous and serrated colon polyps: Secondary | ICD-10-CM | POA: Diagnosis not present

## 2024-06-30 DIAGNOSIS — G4730 Sleep apnea, unspecified: Secondary | ICD-10-CM | POA: Diagnosis not present

## 2024-06-30 DIAGNOSIS — K635 Polyp of colon: Secondary | ICD-10-CM

## 2024-06-30 DIAGNOSIS — F17210 Nicotine dependence, cigarettes, uncomplicated: Secondary | ICD-10-CM | POA: Diagnosis not present

## 2024-06-30 DIAGNOSIS — Z833 Family history of diabetes mellitus: Secondary | ICD-10-CM | POA: Insufficient documentation

## 2024-06-30 DIAGNOSIS — F419 Anxiety disorder, unspecified: Secondary | ICD-10-CM | POA: Insufficient documentation

## 2024-06-30 DIAGNOSIS — Z6837 Body mass index (BMI) 37.0-37.9, adult: Secondary | ICD-10-CM | POA: Diagnosis not present

## 2024-06-30 DIAGNOSIS — Z7985 Long-term (current) use of injectable non-insulin antidiabetic drugs: Secondary | ICD-10-CM | POA: Insufficient documentation

## 2024-06-30 DIAGNOSIS — D123 Benign neoplasm of transverse colon: Secondary | ICD-10-CM | POA: Insufficient documentation

## 2024-06-30 DIAGNOSIS — E119 Type 2 diabetes mellitus without complications: Secondary | ICD-10-CM | POA: Diagnosis not present

## 2024-06-30 DIAGNOSIS — E66813 Obesity, class 3: Secondary | ICD-10-CM | POA: Insufficient documentation

## 2024-06-30 DIAGNOSIS — Z79899 Other long term (current) drug therapy: Secondary | ICD-10-CM | POA: Insufficient documentation

## 2024-06-30 DIAGNOSIS — Z794 Long term (current) use of insulin: Secondary | ICD-10-CM | POA: Insufficient documentation

## 2024-06-30 DIAGNOSIS — Z7984 Long term (current) use of oral hypoglycemic drugs: Secondary | ICD-10-CM | POA: Diagnosis not present

## 2024-06-30 DIAGNOSIS — J4489 Other specified chronic obstructive pulmonary disease: Secondary | ICD-10-CM | POA: Diagnosis not present

## 2024-06-30 DIAGNOSIS — Z83719 Family history of colon polyps, unspecified: Secondary | ICD-10-CM | POA: Insufficient documentation

## 2024-06-30 DIAGNOSIS — I10 Essential (primary) hypertension: Secondary | ICD-10-CM | POA: Insufficient documentation

## 2024-06-30 DIAGNOSIS — Z1211 Encounter for screening for malignant neoplasm of colon: Secondary | ICD-10-CM | POA: Insufficient documentation

## 2024-06-30 DIAGNOSIS — K219 Gastro-esophageal reflux disease without esophagitis: Secondary | ICD-10-CM | POA: Insufficient documentation

## 2024-06-30 DIAGNOSIS — Z8249 Family history of ischemic heart disease and other diseases of the circulatory system: Secondary | ICD-10-CM | POA: Insufficient documentation

## 2024-06-30 HISTORY — PX: COLONOSCOPY: SHX5424

## 2024-06-30 HISTORY — PX: POLYPECTOMY: SHX149

## 2024-06-30 HISTORY — DX: Chronic obstructive pulmonary disease, unspecified: J44.9

## 2024-06-30 LAB — GLUCOSE, CAPILLARY: Glucose-Capillary: 130 mg/dL — ABNORMAL HIGH (ref 70–99)

## 2024-06-30 SURGERY — COLONOSCOPY
Anesthesia: General

## 2024-06-30 MED ORDER — PROPOFOL 10 MG/ML IV BOLUS
INTRAVENOUS | Status: DC | PRN
  Administered 2024-06-30: 70 mg via INTRAVENOUS

## 2024-06-30 MED ORDER — LIDOCAINE HCL (CARDIAC) PF 100 MG/5ML IV SOSY
PREFILLED_SYRINGE | INTRAVENOUS | Status: DC | PRN
  Administered 2024-06-30: 50 mg via INTRAVENOUS

## 2024-06-30 MED ORDER — SODIUM CHLORIDE 0.9 % IV SOLN: INTRAVENOUS | Status: DC

## 2024-06-30 MED ORDER — PROPOFOL 500 MG/50ML IV EMUL
INTRAVENOUS | Status: DC | PRN
  Administered 2024-06-30: 130 ug/kg/min via INTRAVENOUS

## 2024-06-30 NOTE — H&P (Signed)
 "  Rogelia Copping, MD [facility] [address] [place], [place] [address] Phone:[phone] Fax : [phone]  Primary Care Physician:  [facility] [place] [facility] Primary Gastroenterologist:  Dr. Copping  Pre-Procedure History & Physical: HPI:  Stacey Beck is a 64 y.o. female is here for an colonoscopy.   Past Medical History:  Diagnosis Date   Anxiety    Arthritis    Asthma    Collagen vascular disease    COPD (chronic obstructive pulmonary disease) (HCC)    Diabetes mellitus without complication (HCC)    GERD (gastroesophageal reflux disease)    Hypertension    Kidney stone    Morbid obesity (HCC)    Sleep apnea     Past Surgical History:  Procedure Laterality Date   ABDOMINAL HYSTERECTOMY  10/2003   Partial   ARTHROSCOPIC REPAIR ACL Right 2000   BREAST SURGERY Right 2015   Biopsy- Keratoacanthoma   CHOLECYSTECTOMY  2000   Laparoscopic   COLONOSCOPY WITH PROPOFOL  N/A 04/23/2023   Procedure: COLONOSCOPY WITH PROPOFOL ;  Surgeon: Unk Corinn Skiff, MD;  Location: [facility] ENDOSCOPY;  Service: Gastroenterology;  Laterality: N/A;   HERNIA REPAIR  09/08/2010   [facility]- Dr. Luba   JOINT REPLACEMENT     POLYPECTOMY  04/23/2023   Procedure: POLYPECTOMY;  Surgeon: Unk Corinn Skiff, MD;  Location: [facility] ENDOSCOPY;  Service: Gastroenterology;;   TONSILLECTOMY  as child   TUBAL LIGATION  1991    Prior to Admission medications  Medication Sig Start Date End Date Taking? Authorizing Provider  albuterol (PROVENTIL HFA;VENTOLIN HFA) 108 (90 BASE) MCG/ACT inhaler Inhale 2 puffs into the lungs every 4 (four) hours as needed for wheezing or shortness of breath.   Yes [provider]  empagliflozin (JARDIANCE) 10 MG TABS tablet Take by mouth daily.   Yes [provider]  insulin glargine (LANTUS) 100 UNIT/ML injection Inject 10 Units into the skin at bedtime.   Yes [provider]  lisinopril (PRINIVIL,ZESTRIL) 10 MG tablet Take 10 mg by mouth daily.   Yes  [provider]  pregabalin (LYRICA) 25 MG capsule Take 25 mg by mouth 2 (two) times daily.   Yes [provider]  ALPRAZolam (XANAX) 1 MG tablet Take 1 mg by mouth 3 (three) times daily as needed for anxiety. Patient not taking: Reported on 06/30/2024    [provider]  ARIPiprazole (ABILIFY) 2 MG tablet SMARTSIG:1 Tablet(s) By Mouth Every Evening 09/07/20   [provider]  cetirizine  (ZYRTEC ) 10 MG tablet Take 1 tablet (10 mg total) by mouth daily. 09/19/20   Adah Wilbert LABOR, FNP  clotrimazole -betamethasone  (LOTRISONE ) cream Apply 1 application topically 2 (two) times daily. 05/25/21   Tobie Franky SQUIBB, DPM  Dulaglutide 1.5 MG/0.5ML SOAJ Inject into the skin every 7 (seven) days.    [provider]  etodolac  (LODINE ) 400 MG tablet Take 1 tablet (400 mg total) by mouth 2 (two) times daily. 04/02/18   Saunders Shona CROME, PA-C  fluticasone  (FLONASE ) 50 MCG/ACT nasal spray Place 2 sprays into both nostrils daily. 09/19/20   Adah Wilbert A, FNP  gabapentin (NEURONTIN) 400 MG capsule Take 400 mg by mouth 3 (three) times daily. Patient not taking: Reported on 06/30/2024    [provider]  HYDROcodone -acetaminophen  (NORCO/VICODIN) 5-325 MG tablet Take 1 tablet by mouth every 6 (six) hours as needed for moderate pain. 04/02/18   Saunders Shona CROME, PA-C  lamoTRIgine (LAMICTAL) 25 MG tablet Take by mouth. 09/08/20   [provider]  linaclotide (  LINZESS) 290 MCG CAPS capsule Take 290 mcg by mouth daily before breakfast. Patient not taking: Reported on 06/30/2024    [provider]  omeprazole (PRILOSEC) 20 MG capsule Take 20 mg by mouth daily.    [provider]  tiotropium (SPIRIVA) 18 MCG inhalation capsule Place 18 mcg into inhaler and inhale daily.    [provider]  traMADol (ULTRAM) 50 MG tablet Take 50 mg by mouth every 6 (six) hours as needed. Patient not taking: Reported on 06/30/2024    [provider]   Fluticasone -Salmeterol (ADVAIR) 250-50 MCG/DOSE AEPB Inhale 1 puff into the lungs 2 (two) times daily.  09/19/20  [provider]    Allergies as of 04/30/2024   (No Known Allergies)    Family History  Problem Relation Age of Onset   Peripheral vascular disease Mother    Heart disease Father    Diabetes Father    Hypertension Father    Seizures Sister    Colon polyps Sister    Leukemia Sister    Lymphoma Sister    Lung cancer Paternal Aunt    Diabetes Son    Hypertension Son    Lung cancer Cousin    Cancer Cousin        unk type   Throat cancer Cousin     Social History   Socioeconomic History   Marital status: Widowed    Spouse name: Not on file   Number of children: Not on file   Years of education: Not on file   Highest education level: Not on file  Occupational History   Not on file  Tobacco Use   Smoking status: Every Day    Current packs/day: 0.50    Types: Cigarettes   Smokeless tobacco: Never  Vaping Use   Vaping status: Never Used  Substance and Sexual Activity   Alcohol use: No   Drug use: Not Currently   Sexual activity: Not on file  Other Topics Concern   Not on file  Social History Narrative   Not on file   Social Drivers of Health   Tobacco Use: High Risk (06/30/2024)   Patient History    Smoking Tobacco Use: Every Day    Smokeless Tobacco Use: Never    Passive Exposure: Not on file  Financial Resource Strain: Low Risk (10/13/2023)   Received from [facility]   Overall Financial Resource Strain (CARDIA)    Difficulty of Paying Living Expenses: Not very hard  Food Insecurity: No Food Insecurity (10/13/2023)   Received from [facility]   Epic    Within the past 12 months, you worried that your food would run out before you got the money to buy more.: Never true    Within the past 12 months, the food you bought just didn't last and you didn't have money to get more.: Never true  Transportation Needs: No Transportation Needs  (10/13/2023)   Received from [facility]   PRAPARE - Transportation    Lack of Transportation (Medical): No    Lack of Transportation (Non-Medical): No  Physical Activity: Not on file  Stress: Not on file  Social Connections: Not on file  Intimate Partner Violence: Not on file  Depression (EYV7-0): Not on file  Alcohol Screen: Not on file  Housing: High Risk (07/01/2023)   Received from [facility]   Epic    In the last 12 months, was there a time when you were not able to  pay the mortgage or rent on time?: Yes    In the past 12 months, how many times have you moved where you were living?: 0    At any time in the past 12 months, were you homeless or living in a shelter (including now)?: No  Utilities: Low Risk (10/13/2023)   Received from [facility]   Utilities    Within the past 12 months, have you been unable to get utilities(heat, electricity) when it was really needed?: No  Health Literacy: Not on file    Review of Systems: See HPI, otherwise negative ROS  Physical Exam: BP (!) 126/95   Pulse 87   Temp (!) 96 F (35.6 C) (Temporal)   Resp 16   Ht 5' 7 (1.702 m)   Wt 109 kg   SpO2 97%   BMI 37.65 kg/m  General:   Alert,  pleasant and cooperative in NAD Head:  Normocephalic and atraumatic. Neck:  Supple; no masses or thyromegaly. Lungs:  Clear throughout to auscultation.    Heart:  Regular rate and rhythm. Abdomen:  Soft, nontender and nondistended. Normal bowel sounds, without guarding, and without rebound.   Neurologic:  Alert and  oriented x4;  grossly normal neurologically.  Impression/Plan: Stacey Beck is here for an colonoscopy to be performed for a history of adenomatous polyps on 2024   Risks, benefits, limitations, and alternatives regarding  colonoscopy have been reviewed with the patient.  Questions have been answered.  All parties agreeable.   Rogelia Copping, MD  06/30/2024, 9:35 AM "

## 2024-06-30 NOTE — Anesthesia Preprocedure Evaluation (Addendum)
 "                                  Anesthesia Evaluation  Patient identified by MRN, date of birth, ID band Patient awake    Reviewed: Allergy & Precautions, NPO status , Patient's Chart, lab work & pertinent test results  History of Anesthesia Complications Negative for: history of anesthetic complications  Airway Mallampati: III  TM Distance: >3 FB Neck ROM: full    Dental  (+) Edentulous Upper, Missing   Pulmonary shortness of breath, asthma , sleep apnea , COPD,  COPD inhaler, Current Smoker   Pulmonary exam normal        Cardiovascular hypertension, On Medications Normal cardiovascular exam     Neuro/Psych  PSYCHIATRIC DISORDERS Anxiety     negative neurological ROS     GI/Hepatic Neg liver ROS,GERD  ,,  Endo/Other  diabetes  Class 3 obesity  Renal/GU negative Renal ROS  negative genitourinary   Musculoskeletal   Abdominal   Peds  Hematology negative hematology ROS (+)   Anesthesia Other Findings Past Medical History: No date: Anxiety No date: Arthritis No date: Asthma No date: Collagen vascular disease No date: Diabetes mellitus without complication (HCC) No date: GERD (gastroesophageal reflux disease) No date: Hypertension No date: Kidney stone No date: Morbid obesity (HCC) No date: Sleep apnea  Past Surgical History: 10/2003: ABDOMINAL HYSTERECTOMY     Comment:  Partial 2000: ARTHROSCOPIC REPAIR ACL; Right 2015: BREAST SURGERY; Right     Comment:  Biopsy- Keratoacanthoma 2000: CHOLECYSTECTOMY     Comment:  Laparoscopic 04/23/2023: COLONOSCOPY WITH PROPOFOL ; N/A     Comment:  Procedure: COLONOSCOPY WITH PROPOFOL ;  Surgeon: Unk Corinn Skiff, MD;  Location: [facility] ENDOSCOPY;  Service:               Gastroenterology;  Laterality: N/A; 09/08/2010: HERNIA REPAIR     Comment:  [facility]- Dr. Luba No date: JOINT REPLACEMENT 04/23/2023: POLYPECTOMY     Comment:  Procedure: POLYPECTOMY;  Surgeon: Unk Corinn Skiff,                MD;  Location: [facility] ENDOSCOPY;  Service:               Gastroenterology;; as child: TONSILLECTOMY 1991: TUBAL LIGATION     Reproductive/Obstetrics negative OB ROS                              Anesthesia Physical Anesthesia Plan  ASA: 3  Anesthesia Plan: General   Post-op Pain Management: Minimal or no pain anticipated   Induction: Intravenous  PONV Risk Score and Plan: 2 and Propofol  infusion and TIVA  Airway Management Planned: Natural Airway and Nasal Cannula  Additional Equipment:   Intra-op Plan:   Post-operative Plan:   Informed Consent: I have reviewed the patients History and Physical, chart, labs and discussed the procedure including the risks, benefits and alternatives for the proposed anesthesia with the patient or authorized representative who has indicated his/her understanding and acceptance.     Dental Advisory Given  Plan Discussed with: Anesthesiologist, CRNA and Surgeon  Anesthesia Plan Comments: (Patient consented for risks of anesthesia including but not limited to:  - adverse reactions to medications - risk of airway placement if required - damage to eyes, teeth, lips or  other oral mucosa - nerve damage due to positioning  - sore throat or hoarseness - Damage to heart, brain, nerves, lungs, other parts of body or loss of life  Patient voiced understanding and assent.)         Anesthesia Quick Evaluation  "

## 2024-06-30 NOTE — Transfer of Care (Signed)
 Immediate Anesthesia Transfer of Care Note  Patient: Stacey Beck  Procedure(s) Performed: COLONOSCOPY POLYPECTOMY, INTESTINE  Patient Location: PACU  Anesthesia Type:General  Level of Consciousness: awake, alert , and oriented  Airway & Oxygen Therapy: Patient Spontanous Breathing  Post-op Assessment: Report given to RN and Patient moving all extremities  Post vital signs: Reviewed and stable  Last Vitals:  Vitals Value Taken Time  BP 120/89 06/30/24 10:09  Temp    Pulse 83 06/30/24 10:09  Resp 16 06/30/24 10:09  SpO2 98 % 06/30/24 10:09  Vitals shown include unfiled device data.  Last Pain:  Vitals:   06/30/24 0900  TempSrc: Temporal         Complications: No notable events documented.

## 2024-06-30 NOTE — Anesthesia Postprocedure Evaluation (Signed)
"   Anesthesia Post Note  Patient: Stacey Beck  Procedure(s) Performed: COLONOSCOPY POLYPECTOMY, INTESTINE  Patient location during evaluation: Endoscopy Anesthesia Type: General Level of consciousness: awake and alert Pain management: pain level controlled Vital Signs Assessment: post-procedure vital signs reviewed and stable Respiratory status: spontaneous breathing, nonlabored ventilation, respiratory function stable and patient connected to nasal cannula oxygen Cardiovascular status: blood pressure returned to baseline and stable Postop Assessment: no apparent nausea or vomiting Anesthetic complications: no   No notable events documented.   Last Vitals:  Vitals:   06/30/24 1009 06/30/24 1018  BP: 120/89 (!) 136/94  Pulse: 83 75  Resp: 16 18  Temp: (!) 35.8 C   SpO2: 98% 100%    Last Pain:  Vitals:   06/30/24 1018  TempSrc:   PainSc: 0-No pain                 Lendia LITTIE Mae      "

## 2024-06-30 NOTE — Op Note (Signed)
 [facility] Gastroenterology Patient Name: Stacey Beck Procedure Date: 06/30/2024 9:32 AM MRN: [id number] Account #: [id number] Date of Birth: September 01, 1960 Admit Type: Outpatient Age: 64 Room: [facility] ENDO ROOM 4 Gender: Female Note Status: Finalized Instrument Name: Colon Scope [id number] Procedure:             Colonoscopy Indications:           High risk colon cancer surveillance: Personal history                         of colonic polyps Providers:             Rogelia Copping MD, MD Referring MD:          No Local Md, MD (Referring MD) Medicines:             Propofol  per Anesthesia Complications:         No immediate complications. Procedure:             Pre-Anesthesia Assessment:                        - Prior to the procedure, a History and Physical was                         performed, and patient medications and allergies were                         reviewed. The patient's tolerance of previous                         anesthesia was also reviewed. The risks and benefits                         of the procedure and the sedation options and risks                         were discussed with the patient. All questions were                         answered, and informed consent was obtained. Prior                         Anticoagulants: The patient has taken no anticoagulant                         or antiplatelet agents. ASA Grade Assessment: II - A                         patient with mild systemic disease. After reviewing                         the risks and benefits, the patient was deemed in                         satisfactory condition to undergo the procedure.                        After obtaining informed consent, the colonoscope was  passed under direct vision. Throughout the procedure,                         the patient's blood pressure, pulse, and oxygen                         saturations were monitored continuously. The                          Colonoscope was introduced through the anus and                         advanced to the the cecum, identified by appendiceal                         orifice and ileocecal valve. The colonoscopy was                         performed without difficulty. The patient tolerated                         the procedure well. The quality of the bowel                         preparation was good. Findings:      The perianal and digital rectal examinations were normal.      Four sessile polyps were found in the transverse colon. The polyps were       3 to 5 mm in size. These polyps were removed with a cold snare.       Resection and retrieval were complete.      A 4 mm polyp was found in the descending colon. The polyp was sessile.       The polyp was removed with a cold snare. Resection and retrieval were       complete.      A 4 mm polyp was found in the sigmoid colon. The polyp was sessile. The       polyp was removed with a cold snare. Resection and retrieval were       complete.      A 3 mm polyp was found in the cecum. The polyp was sessile. The polyp       was removed with a cold snare. Resection and retrieval were complete. Impression:            - Four 3 to 5 mm polyps in the transverse colon,                         removed with a cold snare. Resected and retrieved.                        - One 4 mm polyp in the descending colon, removed with                         a cold snare. Resected and retrieved.                        - One 4 mm polyp in the sigmoid colon, removed with a  cold snare. Resected and retrieved.                        - One 3 mm polyp in the cecum, removed with a cold                         snare. Resected and retrieved. Recommendation:        - Discharge patient to home.                        - Resume previous diet.                        - Continue present medications.                        - Await pathology results.                         - Repeat colonoscopy in 3 years for surveillance. Procedure Code(s):     --- Professional ---                        [phone], Colonoscopy, flexible; with removal of                         tumor(s), polyp(s), or other lesion(s) by snare                         technique Diagnosis Code(s):     --- Professional ---                        Z86.010, Personal history of colonic polyps                        D12.3, Benign neoplasm of transverse colon (hepatic                         flexure or splenic flexure) CPT copyright 2022 [employer]. All rights reserved. The codes documented in this report are preliminary and upon coder review may  be revised to meet current compliance requirements. Rogelia Copping MD, MD 06/30/2024 10:04:43 AM This report has been signed electronically. Number of Addenda: 0 Note Initiated On: 06/30/2024 9:32 AM Scope Withdrawal Time: 0 hours 9 minutes 34 seconds  Total Procedure Duration: 0 hours 14 minutes 43 seconds  Estimated Blood Loss:  Estimated blood loss: none.      [facility]

## 2024-07-01 LAB — SURGICAL PATHOLOGY

## 2024-07-02 ENCOUNTER — Ambulatory Visit: Payer: Self-pay | Admitting: Gastroenterology
